# Patient Record
Sex: Male | Born: 1975 | Race: White | Hispanic: No | Marital: Single | Smoking: Never smoker
Health system: Southern US, Community
[De-identification: ages and names within clinical notes are randomized; demographics above are authoritative.]

## PROBLEM LIST (undated history)

## (undated) DIAGNOSIS — I4892 Unspecified atrial flutter: Secondary | ICD-10-CM

## (undated) DIAGNOSIS — D352 Benign neoplasm of pituitary gland: Secondary | ICD-10-CM

## (undated) HISTORY — DX: Unspecified atrial flutter: I48.92

## (undated) HISTORY — DX: Benign neoplasm of pituitary gland: D35.2

---

## 2004-03-04 HISTORY — PX: VASECTOMY: SHX75

## 2011-06-05 ENCOUNTER — Ambulatory Visit: Payer: Self-pay | Admitting: Internal Medicine

## 2013-12-08 DIAGNOSIS — E291 Testicular hypofunction: Secondary | ICD-10-CM | POA: Insufficient documentation

## 2013-12-08 DIAGNOSIS — E221 Hyperprolactinemia: Secondary | ICD-10-CM | POA: Insufficient documentation

## 2014-04-18 ENCOUNTER — Ambulatory Visit (INDEPENDENT_AMBULATORY_CARE_PROVIDER_SITE_OTHER): Payer: PRIVATE HEALTH INSURANCE | Admitting: Sports Medicine

## 2014-04-18 ENCOUNTER — Encounter: Payer: Self-pay | Admitting: Sports Medicine

## 2014-04-18 VITALS — BP 127/68 | Ht 72.0 in | Wt 189.0 lb

## 2014-04-18 DIAGNOSIS — M7750 Other enthesopathy of unspecified foot: Secondary | ICD-10-CM

## 2014-04-18 DIAGNOSIS — M7741 Metatarsalgia, right foot: Secondary | ICD-10-CM

## 2014-04-18 NOTE — Progress Notes (Signed)
   Subjective:    Patient ID: Terry Henson, male    DOB: Oct 02, 1975, 38 y.o.   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 He localizes the pain to the plantar aspect of his right foot near the second metatarsal. It began acutely after a race on August 8th. That race consisted of running up quite a few hills. He started to have pain shortly thereafter. He notices that his pain is most severe with walking. He has been able to continue running and in fact he was able to run in a couple of races this past weekend with little to no returning pain. Today he has minimal pain. He localizes his discomfort to the second metatarsal head. He denies pain elsewhere in the foot. He has not noticed any swelling. He denies any numbness or tingling into his toes. He has a pair of rigid three-quarter length orthotics which were made for him many years ago. Up until now he has found those orthotics to be very comfortable. Prior to his foot pain starting, he developed a joggers toe nail on the right great toe. This caused him to alter his running gait just a bit. He is wondering whether or not his pain is a combination of this altered running form coupled with his three-quarter lengths orthotics and his very hilly race in early August.  Past medical history. He has a history of a macro prolactinoma. Medications include cabergoline and testosterone cypionate. 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    Review of Systems     Objective:   Physical Exam Well-developed, fit-appearing. No acute distress. Awake alert and oriented x3. Vital signs reviewed.  Right foot: There is tenderness to palpation at the second metatarsal head. Slight tenderness to palpation between the second and third metatarsal heads as well. Slight callus buildup. No tenderness to  palpation across the dorsum of the foot. Negative metatarsal squeeze. Negative mulders. Cavus foot. Neurovascularly intact distally.  MSK ultrasound of his right foot: Limited images of his foot were obtained. There is fluid within the second metatarsal joint space as well as some surrounding soft tissue edema at the plantar aspect of the second metatarsal head. No obvious Morton's neuroma. Flexor tendon is intact. No stress fracture. Findings consistent with metatarsalgia.       Assessment & Plan:  Right foot pain secondary to metatarsalgia  We are going to give the patient a pair of green sports insoles with a metatarsal pad on the right. After some experimentation we were able to correctly place the pad in a comfortable position. He is going to continue with his three-quarter length semirigid orthotics as well. He will return to the office in 4 weeks for construction of full-length semirigid custom orthotics. He may continue with activity as tolerated in the interim.

## 2014-05-19 ENCOUNTER — Encounter: Payer: Self-pay | Admitting: Sports Medicine

## 2014-05-19 ENCOUNTER — Ambulatory Visit (INDEPENDENT_AMBULATORY_CARE_PROVIDER_SITE_OTHER): Payer: PRIVATE HEALTH INSURANCE | Admitting: Sports Medicine

## 2014-05-19 VITALS — BP 117/79 | HR 55 | Ht 72.0 in | Wt 189.0 lb

## 2014-05-19 DIAGNOSIS — M216X9 Other acquired deformities of unspecified foot: Secondary | ICD-10-CM

## 2014-05-19 DIAGNOSIS — G8929 Other chronic pain: Secondary | ICD-10-CM

## 2014-05-19 DIAGNOSIS — M79671 Pain in right foot: Secondary | ICD-10-CM

## 2014-05-19 NOTE — Progress Notes (Signed)
   Subjective:    Patient ID: Terry Henson, male    DOB: [birth year], 38 y.o.   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  Pain has been ongoing now for about 2 months, exacerbated by race in August where he was running up/down hills.  He localizes his discomfort to the second metatarsal head of plantar aspect. He denies pain elsewhere in the foot. He has not noticed any swelling. He denies any numbness or tingling into his toes. He has a pair of rigid three-quarter length orthotics which were made for him many years ago. Up until now he has found those orthotics to be very comfortable and at last visit, he had green insoles with MT pad inserted to try to help with the pain. However, pt was unable to use this due to compression in his shoe and wore the insoles for about 1-2 days.  However, his pain has continued to improve and only notices it when barefoot or after 12+ mile runs.  Has not changed shoes or training routine since being seen at last visit.   Past medical history. He has a history of a macro prolactinoma.  PSHx- Non contributory Medications include cabergoline and testosterone cypionate. 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 FHx - Noncontributory     Review of Systems Per HPI, 12 point review of systems otherwise is negative     Objective:   Physical Exam Well-developed, fit-appearing. No acute distress. Awake alert and oriented x3. Vital signs reviewed.  Right foot:  Toes 2-3 on the R with hammering  There is tenderness to palpation at the second metatarsal head/third MT head + Morton's callous and Morton's foot  No tenderness to palpation across the dorsum of the foot.  Negative metatarsal squeeze.  Negative mulders.  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  Midfoot-Forefoot striker   Neurovascularly intact distally.     Assessment & Plan:  Right foot pain secondary to metatarsalgia  - Pt fitted today for custom orthotics due to ongoing pain in the MT region and not tolerating MT padding  -Patient was fitted for a : standard, cushioned, semi-rigid orthotic. The orthotic was heated and afterward the patient stood on the orthotic blank positioned on the orthotic stand. The patient was positioned in subtalar neutral position and 10 degrees of ankle dorsiflexion in a weight bearing stance. After completion of molding, a stable base was applied to the orthotic blank. The blank was ground to a stable position for weight bearing. Size: 11 Base: Blue EVA Posting: None  Additional orthotic padding: None -F/U PRN   >50% of the 25 minute visit was spent face to face counseling and discussing management options with the pt.

## 2015-12-28 ENCOUNTER — Other Ambulatory Visit: Payer: Self-pay

## 2015-12-28 ENCOUNTER — Encounter: Payer: Self-pay | Admitting: Family Medicine

## 2015-12-28 ENCOUNTER — Ambulatory Visit (INDEPENDENT_AMBULATORY_CARE_PROVIDER_SITE_OTHER)
Admission: RE | Admit: 2015-12-28 | Discharge: 2015-12-28 | Disposition: A | Discharge status: Home / Self Care | Payer: No Typology Code available for payment source | Source: Ambulatory Visit | Attending: Family Medicine | Admitting: Family Medicine

## 2015-12-28 ENCOUNTER — Ambulatory Visit (INDEPENDENT_AMBULATORY_CARE_PROVIDER_SITE_OTHER): Payer: No Typology Code available for payment source | Admitting: Family Medicine

## 2015-12-28 VITALS — BP 100/70 | HR 63 | Ht 72.0 in | Wt 181.0 lb

## 2015-12-28 DIAGNOSIS — M25551 Pain in right hip: Secondary | ICD-10-CM

## 2015-12-28 DIAGNOSIS — M5416 Radiculopathy, lumbar region: Secondary | ICD-10-CM | POA: Diagnosis not present

## 2015-12-28 DIAGNOSIS — S76201A Unspecified injury of adductor muscle, fascia and tendon of right thigh, initial encounter: Secondary | ICD-10-CM | POA: Diagnosis not present

## 2015-12-28 MED ORDER — GABAPENTIN 100 MG PO CAPS: 200.0000 mg | ORAL_CAPSULE | Freq: Every day | ORAL | Status: DC

## 2015-12-28 MED ORDER — PREDNISONE 50 MG PO TABS: 50.0000 mg | ORAL_TABLET | Freq: Every day | ORAL | Status: DC

## 2015-12-28 NOTE — Progress Notes (Signed)
[facility] [address], [place] [address] Phone: [phone] Subjective:     CC: Right hip pain   [id number] Terry Henson is a 40 y.o. male coming in with complaint of right hip pain. 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 Patient has noticed over the course last 2 months when he is running sometimes he gets the discomfort that seems to radiate from the posterior aspect of his hip anteriorly around the lateral aspect of his hip. Sometimes can be associated with some groin pain. Patient states once he stops stretches he seems to do relatively well. Patient states it is starting to affect his running. Patient does not remember any true injury but does remember 2 months ago while he was in a marathon he started having the discomfort. Rates the severity of pain is 4 out of 10. Tries not to take any over-the-counter medications. Continues to take vitamins regularly regular basis. Denies any radiation down the line, any numbness, or any weakness. Patient states that his leg does feels fatigued from time to time. Does have some associated back pain with it.     Past Medical History  Diagnosis Date  . Macroprolactinoma [facility])    Past Surgical History  Procedure Laterality Date  . Vasectomy  03/2004   Social History   Social History  . Marital Status: Divorced    Spouse Name: N/A  . Number of Children: N/A  . Years of Education: N/A   Social History Main Topics  . Smoking status: Never Smoker   . Smokeless tobacco: Never Used  . Alcohol Use: Yes  . Drug Use: No  . Sexual Activity: Not Asked   Other Topics Concern  . None   Social History Narrative   No Known Allergies Family History  Problem Relation Age of Onset  . Alcohol abuse Mother   . Hyperlipidemia Mother   . Hypertension Mother   . Mental illness Mother   . Alcohol abuse Father   . Hyperlipidemia Father   . Hypertension Father   . Mental illness  Father   . Cancer Sister     Past medical history, social, surgical and family history all reviewed in electronic medical record.  No pertanent information unless stated regarding to the chief complaint.   Review of Systems: No headache, visual changes, nausea, vomiting, diarrhea, constipation, dizziness, abdominal pain, skin rash, fevers, chills, night sweats, weight loss, swollen lymph nodes, body aches, joint swelling, muscle aches, chest pain, shortness of breath, mood changes.   Objective Blood pressure 100/70, pulse 63, height 6' (1.829 m), weight 181 lb (82.101 kg), SpO2 97 %.  General: No apparent distress alert and oriented x3 mood and affect normal, dressed appropriately.  HEENT: Pupils equal, extraocular movements intact  Respiratory: Patient's speak in full sentences and does not appear short of breath  Cardiovascular: No lower extremity edema, non tender, no erythema  Skin: Warm dry intact with no signs of infection or rash on extremities or on axial skeleton.  Abdomen: Soft nontender  Neuro: Cranial nerves II through XII are intact, neurovascularly intact in all extremities with 2+ DTRs and 2+ pulses.  Lymph: No lymphadenopathy of posterior or anterior cervical chain or axillae bilaterally.  Gait normal with good balance and coordination.  MSK:  Non tender with full range of motion and good stability and symmetric strength and tone of shoulders, elbows, wrist,  knee and ankles bilaterally.  Hip: right ROM  IR: 15 Deg, ER: 35 Deg, Flexion: 100 Deg, Extension: 80 Deg, Abduction: 45 Deg, Adduction: 255 Deg Strength IR: 5/5, ER: 5/5, Flexion: 5/5, Extension: 5/5, Abduction: 4/5, Adduction: 4/5 Pelvic alignment unremarkable to inspection and palpation. Standing hip rotation and gait without trendelenburg sign / unsteadiness. Greater trochanter without tenderness to palpation. No tenderness over piriformis and greater trochanter. No pain with FABER or FADIR. No SI joint  tenderness and normal minimal SI movement. Patient does have what seems to be a mild positive straight leg test. Contralateral hip unremarkable  MSK US performed of: Right hip This study was ordered, performed, and interpreted by Charlann Boxer D.O.  Hip: Trochanteric bursa without swelling or effusion. Acetabular labrum visualized with some degenerative changes but no true tear appreciated Adductor muscle is what appears to be some mild tendinitis at the insertion. Femoral neck appears unremarkable without increased power doppler signal along Cortex.  IMPRESSION:  NORMAL ULTRASONOGRAPHIC EXAMINATION OF THE HIP.    Impression and Recommendations:     This case required medical decision making of moderate complexity.      Note: This dictation was prepared with Dragon dictation along with smaller phrase technology. Any transcriptional errors that result from this process are unintentional.

## 2015-12-28 NOTE — Patient Instructions (Signed)
Good to  See you  Ice is your friend after activity  Focus on hip flexor and back extension for more of your cool down for sure Prednosne daily for 5 days Gabapentin 100mg  at night for first week then 200mg  thereafter Love the vitamins for now Consider Vitamin D 4000IU daily  DHEA 50 mg daily for 4 weeks.  [facility] # [id number] See me again in 3 weeks to make sure you are doing well.

## 2015-12-28 NOTE — Assessment & Plan Note (Signed)
I do believe the patient's pain is likely secondary to more of a lumbar radiculopathy. Patient continues to be very active. No significant weakness but mild radicular symptoms. Based on patient's presentation likely L3-L4. X-rays are pending. Patient given prednisone and gabapentin today. 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 We will not limit his activity at this time but if worsening symptoms patient knows to take a step back. Patient will come back and see me again in 3 weeks for further evaluation.

## 2015-12-28 NOTE — Progress Notes (Signed)
Pre visit review using our clinic review tool, if applicable. No additional management support is needed unless otherwise documented below in the visit note. 

## 2015-12-29 DIAGNOSIS — S76201A Unspecified injury of adductor muscle, fascia and tendon of right thigh, initial encounter: Secondary | ICD-10-CM | POA: Insufficient documentation

## 2016-01-10 ENCOUNTER — Ambulatory Visit: Payer: PRIVATE HEALTH INSURANCE | Admitting: Family Medicine

## 2016-01-16 ENCOUNTER — Encounter: Payer: Self-pay | Admitting: Family Medicine

## 2016-01-16 ENCOUNTER — Ambulatory Visit (INDEPENDENT_AMBULATORY_CARE_PROVIDER_SITE_OTHER): Payer: No Typology Code available for payment source | Admitting: Family Medicine

## 2016-01-16 VITALS — BP 110/70 | HR 56 | Ht 72.0 in | Wt 184.0 lb

## 2016-01-16 DIAGNOSIS — S76201A Unspecified injury of adductor muscle, fascia and tendon of right thigh, initial encounter: Secondary | ICD-10-CM

## 2016-01-16 MED ORDER — VITAMIN D (ERGOCALCIFEROL) 1.25 MG (50000 UNIT) PO CAPS: 50000.0000 [IU] | ORAL_CAPSULE | ORAL | Status: DC

## 2016-01-16 NOTE — Progress Notes (Signed)
Pre visit review using our clinic review tool, if applicable. No additional management support is needed unless otherwise documented below in the visit note. 

## 2016-01-16 NOTE — Progress Notes (Signed)
[facility] [address], [place] [address] Phone: [phone] Subjective:     CC: Right hip pain follow-up  [id number] Terry Henson is a 40 y.o. male coming in with complaint of right hip pain. Patient's tenderness and symptoms and more of a lumbar radiculopathy. Patient elected to try prednisone as well as gabapentin. X-rays were ordered and independently visualized by me showing no significant bony abnormality. Patient does have a history of herniated disc previously though. Patient also had some mild injury of an adductor muscle. Patient was to try some home exercises, icing protocol, and decreased certain activities. Patient states while he is on the prednisone did seem to make a significant improvement. Since he's been off the prednisone and pain has come back. 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 Patient is finding it difficult to continue to do so. He is able to make it usually through a run, or rays but unfortunately then afterwards has some weakness of this right side. Patient states when he goes to bed and wakes up he seems to be doing a little better. Possibly the gabapentin is helping. Patient did get all the other vitamins but has not notice any significant improvement at this time.pain seems to go from the anterior medial aspect of the hip to the posterior lateral aspect of the hip. Does not know exactly what this could be. Has not stopped him from activities at this time.     Past Medical History  Diagnosis Date  . Macroprolactinoma [facility])    Past Surgical History  Procedure Laterality Date  . Vasectomy  03/2004   Social History   Social History  . Marital Status: Divorced    Spouse Name: N/A  . Number of Children: N/A  . Years of Education: N/A   Social History Main Topics  . Smoking status: Never Smoker   . Smokeless tobacco: Never Used  . Alcohol Use: Yes  . Drug Use: No  .  Sexual Activity: Not Asked   Other Topics Concern  . None   Social History Narrative   No Known Allergies Family History  Problem Relation Age of Onset  . Alcohol abuse Mother   . Hyperlipidemia Mother   . Hypertension Mother   . Mental illness Mother   . Alcohol abuse Father   . Hyperlipidemia Father   . Hypertension Father   . Mental illness Father   . Cancer Sister     Past medical history, social, surgical and family history all reviewed in electronic medical record.  No pertanent information unless stated regarding to the chief complaint.   Review of Systems: No headache, visual changes, nausea, vomiting, diarrhea, constipation, dizziness, abdominal pain, skin rash, fevers, chills, night sweats, weight loss, swollen lymph nodes, body aches, joint swelling, muscle aches, chest pain, shortness of breath, mood changes.   Objective Blood pressure 110/70, pulse 56, height 6' (1.829 m), weight 184 lb (83.462 kg), SpO2 98 %.  General: No apparent distress alert and oriented x3 mood and affect normal, dressed appropriately.  HEENT: Pupils equal, extraocular movements intact  Respiratory: Patient's speak in full sentences and does not appear short of breath  Cardiovascular: No lower extremity edema, non tender, no erythema  Skin: Warm dry intact with no signs of infection or rash on extremities or on axial skeleton.  Abdomen: Soft nontender  Neuro: Cranial nerves II through XII are intact, neurovascularly intact in all extremities with 2+  DTRs and 2+ pulses.  Lymph: No lymphadenopathy of posterior or anterior cervical chain or axillae bilaterally.  Gait normal with good balance and coordination.  MSK:  Non tender with full range of motion and good stability and symmetric strength and tone of shoulders, elbows, wrist,  knee and ankles bilaterally.  Hip: right ROM IR: 15 Deg, ER: 35 Deg, Flexion: 100 Deg, Extension: 80 Deg, Abduction: 45 Deg, Adduction: 255 Deg Strength IR: 5/5,  ER: 5/5, Flexion: 5/5, Extension: 5/5, Abduction: 3+/5, Adduction: 3+/5 Pelvic alignment unremarkable to inspection and palpation. Standing hip rotation and gait without trendelenburg sign / unsteadiness. Greater trochanter without tenderness to palpation. No tenderness over piriformis and greater trochanter. No pain with FABER or FADIR. No SI joint tenderness and normal minimal SI movement. Negative straight leg test. Contralateral hip unremarkable Very minimal improvement from previous exam.with possibly some increasing weakness     Impression and Recommendations:     This case required medical decision making of moderate complexity.      Note: This dictation was prepared with Dragon dictation along with smaller phrase technology. Any transcriptional errors that result from this process are unintentional.

## 2016-01-16 NOTE — Assessment & Plan Note (Signed)
Very difficult to assess at this time. Patient has had x-rays of the hip and back with no significant bony abnormality noted. These were and apparently reviewed by me. Patient at this time is having such decrease in function and having even true weakness than I'm concerned and further imaging is warranted at this time. Possibly obturator muscle or nerve injury could be contributing. Had Dr. Lindaann Slough strain, tear or even possible labral pathology could be contributing to this. Modalities fit completely perfect with patient's history but do not know diagnosis at this time without advance imaging. Change patient's vitamin D to once weekly patient will come back again in 1-2 days after MRI and we will discuss further.

## 2016-01-16 NOTE — Patient Instructions (Signed)
Good to see you  I am sorry we do not have an answer yet.  MRI is ordered.  [facility] # [id number] Try the gabapentin before your run, only 100mg  See me again 1-2 days after the MRI and bring the disc.  Once weekly vitamin D

## 2016-01-17 ENCOUNTER — Telehealth: Payer: Self-pay | Admitting: Family Medicine

## 2016-01-17 NOTE — Telephone Encounter (Signed)
Mri order sent to [facility].

## 2016-01-17 NOTE — Telephone Encounter (Signed)
Patient is calling regarding referral   He states that he got the fax # for the [facility] facility you want him to use. That is F: [id number] P: [phone] opt 2

## 2016-01-18 ENCOUNTER — Encounter: Payer: Self-pay | Admitting: Family Medicine

## 2016-01-29 ENCOUNTER — Encounter: Payer: Self-pay | Admitting: Family Medicine

## 2016-02-14 ENCOUNTER — Encounter: Payer: Self-pay | Admitting: Family Medicine

## 2016-04-13 ENCOUNTER — Other Ambulatory Visit: Payer: Self-pay | Admitting: Family Medicine

## 2016-04-14 NOTE — Telephone Encounter (Signed)
Refill done.  

## 2016-07-02 ENCOUNTER — Other Ambulatory Visit: Payer: Self-pay | Admitting: Family Medicine

## 2016-09-28 ENCOUNTER — Other Ambulatory Visit: Payer: Self-pay | Admitting: Family Medicine

## 2016-12-18 ENCOUNTER — Other Ambulatory Visit: Payer: Self-pay | Admitting: Family Medicine

## 2017-03-10 ENCOUNTER — Other Ambulatory Visit: Payer: Self-pay | Admitting: Family Medicine

## 2017-03-10 NOTE — Telephone Encounter (Signed)
Refill done.  

## 2017-12-13 NOTE — Progress Notes (Signed)
[facility] [address], [place] [address] Phone: [phone] Subjective:    I'm seeing this patient by the request  of:    CC: Left leg pain  [id number]  Terry Henson is a 42 y.o. male coming in with complaint of left hamstring pain. 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 Ran on the track a few weeks after. Eliminated running for 3 weeks prior to [facility]. Proximal hamstring he has scar tissue. Noticed bike saddle too high (brand new bike). Later [place] Pain presented. 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 Experienced numbness and tingling once. On a prednisone taper (12 days). Hasn't been running.   Onset- Chronic Location- Hamstring (distal); started medial now it's medial   Character- Dull, ache, sharp, burning  Aggravating factors- Running, kicking with fins, isometric  Reliving factors- Dry needling Therapies tried- Ice, heat, foam roll, cupping Severity-6 out of 10     Past Medical History:  Diagnosis Date  . Macroprolactinoma [facility])    Past Surgical History:  Procedure Laterality Date  . VASECTOMY  03/2004   Social History   Socioeconomic History  . Marital status: Divorced    Spouse name: Not on file  . Number of children: Not on file  . Years of education: Not on file  . Highest education level: Not on file  Occupational History  . Not on file  Social Needs  . Financial resource strain: Not on file  . Food insecurity:    Worry: Not on file    Inability: Not on file  . Transportation needs:    Medical: Not on file    Non-medical: Not on file  Tobacco Use  . Smoking status: Never Smoker  . Smokeless tobacco: Never Used  Substance and Sexual Activity  . Alcohol use: Yes  . Drug use: No  . Sexual activity: Not on file  Lifestyle  . Physical activity:    Days per week: Not on file    Minutes per session: Not on file  . Stress: Not on file  Relationships  . Social  connections:    Talks on phone: Not on file    Gets together: Not on file    Attends religious service: Not on file    Active member of club or organization: Not on file    Attends meetings of clubs or organizations: Not on file    Relationship status: Not on file  Other Topics Concern  . Not on file  Social History Narrative  . Not on file   No Known Allergies Family History  Problem Relation Age of Onset  . Alcohol abuse Mother   . Hyperlipidemia Mother   . Hypertension Mother   . Mental illness Mother   . Alcohol abuse Father   . Hyperlipidemia Father   . Hypertension Father   . Mental illness Father   . Cancer Sister      Past medical history, social, surgical and family history all reviewed in electronic medical record.  No pertanent information unless stated regarding to the chief complaint.   Review of Systems:Review of systems updated and as accurate as of 12/14/17  No headache, visual changes, nausea, vomiting, diarrhea, constipation, dizziness, abdominal pain, skin rash, fevers, chills, night sweats, weight loss, swollen lymph nodes, body aches, joint swelling, muscle aches, chest pain, shortness of breath, mood changes.   Objective  Blood pressure 120/74, pulse 66, height 6' (1.829 m), weight  190 lb (86.2 kg), SpO2 98 %. Systems examined below as of 12/14/17   General: No apparent distress alert and oriented x3 mood and affect normal, dressed appropriately.  HEENT: Pupils equal, extraocular movements intact  Respiratory: Patient's speak in full sentences and does not appear short of breath  Cardiovascular: No lower extremity edema, non tender, no erythema  Skin: Warm dry intact with no signs of infection or rash on extremities or on axial skeleton.  Abdomen: Soft nontender  Neuro: Cranial nerves II through XII are intact, neurovascularly intact in all extremities with 2+ DTRs and 2+ pulses.  Lymph: No lymphadenopathy of posterior or anterior cervical chain or  axillae bilaterally.  Gait normal with good balance and coordination.  MSK:  Non tender with full range of motion and good stability and symmetric strength and tone of shoulders, elbows, wrist, hip, and ankles bilaterally.  Left leg shows the patient does have some tightness with full extension of the knee.  Pain over the distal aspect of the hamstring laterally.  Patient has 4+ out of 5 strength with hamstring flexion compared to the contralateral side.  Limited musculoskeletal ultrasound was performed and interpreted by Lyndal Pulley  Limited ultrasound of patient's distal hamstring does show that patient has what appears to be a fascial irritation with increasing Doppler flow.  No true tear appreciated. Impression: Hamstring irritation of the distal hamstring with no true tear appreciated.   Impression and Recommendations:     This case required medical decision making of moderate complexity.      Note: This dictation was prepared with Dragon dictation along with smaller phrase technology. Any transcriptional errors that result from this process are unintentional.

## 2017-12-14 ENCOUNTER — Ambulatory Visit: Payer: Self-pay

## 2017-12-14 ENCOUNTER — Ambulatory Visit: Payer: 59 | Admitting: Family Medicine

## 2017-12-14 ENCOUNTER — Encounter: Payer: Self-pay | Admitting: Family Medicine

## 2017-12-14 VITALS — BP 120/74 | HR 66 | Ht 72.0 in | Wt 190.0 lb

## 2017-12-14 DIAGNOSIS — S76312A Strain of muscle, fascia and tendon of the posterior muscle group at thigh level, left thigh, initial encounter: Secondary | ICD-10-CM | POA: Diagnosis not present

## 2017-12-14 DIAGNOSIS — S76302A Unspecified injury of muscle, fascia and tendon of the posterior muscle group at thigh level, left thigh, initial encounter: Secondary | ICD-10-CM | POA: Insufficient documentation

## 2017-12-14 DIAGNOSIS — M79605 Pain in left leg: Secondary | ICD-10-CM

## 2017-12-14 MED ORDER — GABAPENTIN 100 MG PO CAPS: 200.0000 mg | ORAL_CAPSULE | Freq: Every day | ORAL | 3 refills | Status: DC

## 2017-12-14 MED ORDER — VITAMIN D (ERGOCALCIFEROL) 1.25 MG (50000 UNIT) PO CAPS: 50000.0000 [IU] | ORAL_CAPSULE | ORAL | 0 refills | Status: DC

## 2017-12-14 NOTE — Patient Instructions (Signed)
Good to see you  Ice 20 minutes 2 times daily. Usually after activity and before bed. [place] exercises would be best  Zone 2 for the next 2 weeks.  Thigh compression with working out but a little higher then where it has been.  Stay hydrated Avoid hills if possible.  Gabapentin 100-200mg  at night Once weekly vitamin D  Dry needling up to 2 times a week  See me again after the race!

## 2017-12-14 NOTE — Assessment & Plan Note (Signed)
Distally I do believe the patient has more of myofascial irritation that could be causing a potential nerve impingement.  This is likely contributing to some of the discomfort and pain.  Patient about icing regimen and home exercises.  We discussed which activities to do which wants to avoid.  Discussed compression.  Topical anti-inflammatories, gabapentin patient given home exercises.  Follow-up again in 4 weeks

## 2018-01-27 DIAGNOSIS — D497 Neoplasm of unspecified behavior of endocrine glands and other parts of nervous system: Secondary | ICD-10-CM | POA: Insufficient documentation

## 2018-02-23 ENCOUNTER — Other Ambulatory Visit: Payer: Self-pay | Admitting: Family Medicine

## 2018-02-23 MED ORDER — VITAMIN D (ERGOCALCIFEROL) 1.25 MG (50000 UNIT) PO CAPS: 50000.0000 [IU] | ORAL_CAPSULE | ORAL | 0 refills | Status: DC

## 2018-02-23 NOTE — Telephone Encounter (Signed)
Refill done.  

## 2018-05-24 ENCOUNTER — Other Ambulatory Visit: Payer: Self-pay | Admitting: Family Medicine

## 2018-09-07 NOTE — Progress Notes (Signed)
[facility] [place], [place] [address] Phone: [phone] Subjective:     CC: Right gluteal pain  SVX:[id number]  Terry Henson is a 43 y.o. male coming in with complaint of right glute pain. Patient last seen in May 2019. Patient was performing RDLs and felt a pop in the right glute near the top of his lift in November 2019. Felt a pop. Also did a lot of rowing and then went for a long run and this exacerbated his pain. No discoloration in the hip at that time. Was able to continue to run. Ran today 11 miles at 6 minute pace. Pain with deep knee bending in glute med. Patient feels like his knee is caving in when he runs. Has been doing hamstring exercises and stability work. Patient also notes going to a plant based diet.     Patient's lumbar spine x-rays were taken in May 2017.  These were independently visualized by me showing no significant arthritic changes.  Past Medical History:  Diagnosis Date  . 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 VASECTOMY  03/2004   Social History   Socioeconomic History  . Marital status: Divorced    Spouse name: Not on file  . Number of children: Not on file  . Years of education: Not on file  . Highest education level: Not on file  Occupational History  . Not on file  Social Needs  . Financial resource strain: Not on file  . Food insecurity:    Worry: Not on file    Inability: Not on file  . Transportation needs:    Medical: Not on file    Non-medical: Not on file  Tobacco Use  . Smoking status: Never Smoker  . Smokeless tobacco: Never Used  Substance and Sexual Activity  . Alcohol use: Yes  . Drug use: No  . Sexual activity: Not on file  Lifestyle  . Physical activity:    Days per week: Not on file    Minutes per session: Not on file  . Stress: Not on file  Relationships  . Social connections:    Talks on phone: Not on file    Gets together:  Not on file    Attends religious service: Not on file    Active member of club or organization: Not on file    Attends meetings of clubs or organizations: Not on file    Relationship status: Not on file  Other Topics Concern  . Not on file  Social History Narrative  . Not on file   No Known Allergies Family History  Problem Relation Age of Onset  . Alcohol abuse Mother   . Hyperlipidemia Mother   . Hypertension Mother   . Mental illness Mother   . Alcohol abuse Father   . Hyperlipidemia Father   . Hypertension Father   . Mental illness Father   . Cancer Sister     Current Outpatient Medications (Endocrine & Metabolic):  .  cabergoline (DOSTINEX) 0.5 MG tablet,  .  testosterone cypionate (DEPOTESTOTERONE CYPIONATE) 200 MG/ML injection,   Current Outpatient Medications (Cardiovascular):  .  nitroGLYCERIN (NITRODUR - DOSED IN MG/24 HR) 0.2 mg/hr patch, 1/4 patch daily     Current Outpatient Medications (Other):  .  gabapentin (NEURONTIN) 100 MG capsule, Take 2 capsules (200 mg total) by mouth at bedtime. .  Vitamin D, Ergocalciferol, (DRISDOL) 50000 units CAPS capsule, TAKE ONE CAPSULE  BY MOUTH ONCE WEEKLY    Past medical history, social, surgical and family history all reviewed in electronic medical record.  No pertanent information unless stated regarding to the chief complaint.   Review of Systems:  No headache, visual changes, nausea, vomiting, diarrhea, constipation, dizziness, abdominal pain, skin rash, fevers, chills, night sweats, weight loss, swollen lymph nodes, body aches, joint swelling, muscle aches, chest pain, shortness of breath, mood changes.   Objective  Blood pressure 120/82, pulse 72, height 6' (1.829 m), weight 182 lb (82.6 kg), SpO2 98 %.   General: No apparent distress alert and oriented x3 mood and affect normal, dressed appropriately.  HEENT: Pupils equal, extraocular movements intact  Respiratory: Patient's speak in full sentences and does not  appear short of breath  Cardiovascular: No lower extremity edema, non tender, no erythema  Skin: Warm dry intact with no signs of infection or rash on extremities or on axial skeleton.  Abdomen: Soft nontender  Neuro: Cranial nerves II through XII are intact, neurovascularly intact in all extremities with 2+ DTRs and 2+ pulses.  Lymph: No lymphadenopathy of posterior or anterior cervical chain or axillae bilaterally.  Gait normal with good balance and coordination.  MSK:  Non tender with full range of motion and good stability and symmetric strength and tone of shoulders, elbows, wrist, , knee and ankles bilaterally.   Foot exam does show the patient does have what appears to be some mild splaying between the first and second toes with mild breakdown of the transverse arch. Back Exam:  Inspection: Unremarkable  Motion: Flexion 45 deg, Extension 45 deg, Side Bending to 45 deg bilaterally,  Rotation to 45 deg bilaterally  SLR laying: Negative  XSLR laying: Negative  Palpable tenderness: None. FABER: Tightness bilaterally.  More pain over the right gluteal area Sensory change: Gross sensation intact to all lumbar and sacral dermatomes.  Reflexes: 2+ at both patellar tendons, 2+ at achilles tendons, Babinski's downgoing.  Strength at foot  Plantar-flexion: 5/5 Dorsi-flexion: 5/5 Eversion: 5/5 Inversion: 5/5  Leg strength  Quad: 5/5 Hamstring: 5/5 Hip flexor: 5/5 Hip abductors: 5/5  Gait unremarkable.  Hip: Right ROM Shows full range of motion.  Patient does have some tightness of the gluteal area and some tenderness.  Some tightness of the hamstring on the right side compared to the contralateral side as well.   Limited musculoskeletal ultrasound was performed and interpreted by Lyndal Pulley  Limited ultrasound of patient's proximal hamstring shows a very mild ischial bursitis as well as what appears to be chronic scar tissue formation around the hamstring.  No true retraction noted.   Patient's gluteal area has what appears to be a neovascularization that seems to be transversing the muscle.  No masses appreciated.  Possible hypoechoic changes between the superficial and deep layers.  No true tear or defect appreciated. Impression: Strain or tendinopathy of the right gluteal medius   Impression and Recommendations:     This case required medical decision making of moderate complexity. The above documentation has been reviewed and is accurate and complete Lyndal Pulley, DO       Note: This dictation was prepared with Dragon dictation along with smaller phrase technology. Any transcriptional errors that result from this process are unintentional.

## 2018-09-08 ENCOUNTER — Encounter: Payer: Self-pay | Admitting: Family Medicine

## 2018-09-08 ENCOUNTER — Ambulatory Visit: Payer: Self-pay

## 2018-09-08 ENCOUNTER — Ambulatory Visit: Payer: 59 | Admitting: Family Medicine

## 2018-09-08 ENCOUNTER — Other Ambulatory Visit: Payer: Self-pay | Admitting: Family Medicine

## 2018-09-08 VITALS — BP 120/82 | HR 72 | Ht 72.0 in | Wt 182.0 lb

## 2018-09-08 DIAGNOSIS — M25551 Pain in right hip: Secondary | ICD-10-CM | POA: Diagnosis not present

## 2018-09-08 DIAGNOSIS — M67951 Unspecified disorder of synovium and tendon, right thigh: Secondary | ICD-10-CM | POA: Insufficient documentation

## 2018-09-08 DIAGNOSIS — M6798 Unspecified disorder of synovium and tendon, other site: Secondary | ICD-10-CM | POA: Diagnosis not present

## 2018-09-08 DIAGNOSIS — M216X2 Other acquired deformities of left foot: Secondary | ICD-10-CM | POA: Insufficient documentation

## 2018-09-08 MED ORDER — NITROGLYCERIN 0.2 MG/HR TD PT24: MEDICATED_PATCH | TRANSDERMAL | 1 refills | Status: DC

## 2018-09-08 NOTE — Telephone Encounter (Signed)
Copied from [place] [id number]. Topic: General - Other >> Sep 08, 2018  5:42 PM Mcneil, Ja-Kwan wrote: Reason for CRM: Pt stated that he received a text alert from [facility] that the Rx for nitroGLYCERIN (NITRODUR - DOSED IN MG/24 HR) 0.2 mg/hr patch is not available. Pt requests that the Rx be sent to [place], [place] [phone] (Phone)  [phone] (Fax)

## 2018-09-08 NOTE — Assessment & Plan Note (Signed)
Transverse arch breakdown discussed with patient has been wearing custom orthotics previously.  They seem to be worn out.  Patient is a very active individual and we will make custom orthotics that are little bit more reactive than what he has had previously.  We will have him set up for that in the near future

## 2018-09-08 NOTE — Patient Instructions (Addendum)
Good to see you  Glute but should do well  K2 daily about 200mg  for 4 weeks Exercises 3 times a week.  No instability or one leg exercises for at least 2 weeks Ice after activity to decrease inflammaton  Nitroglycerin Protocol   Apply 1/4 nitroglycerin patch to affected area daily.  Change position of patch within the affected area every 24 hours.  You may experience a headache during the first 1-2 weeks of using the patch, these should subside.  If you experience headaches after beginning nitroglycerin patch treatment, you may take your preferred over the counter pain reliever.  Another side effect of the nitroglycerin patch is skin irritation or rash related to patch adhesive.  Please notify our office if you develop more severe headaches or rash, and stop the patch.  Tendon healing with nitroglycerin patch may require 12 to 24 weeks depending on the extent of injury.  Men should not use if taking Viagra, Cialis, or Levitra.   Do not use if you have migraines or rosacea.   Compression shorts with a lot of activity  We will call you when we get the orthotics See me again in 5-6 weeks

## 2018-09-08 NOTE — Assessment & Plan Note (Signed)
Tremors gluteus medius injury.  We discussed with patient in great length.  Ultrasound does show some mild increase in neovascularization in the area but no true tear appreciated.  Does appear that between the deep and superficial fibers a potential layer of hypoechoic changes.  Patient will do nitroglycerin.  Warned potential side effects.  Did check any interaction with his other medications and he should do relatively well.  We discussed home exercises and patient work with Product/process development scientist.  Discussed compression, discussed continuing the vitamin D.  Patient will continue to be active and follow-up with me again in 4 to 8 weeks

## 2018-09-16 ENCOUNTER — Telehealth: Payer: Self-pay

## 2018-09-16 NOTE — Telephone Encounter (Signed)
Called patient to schedule orthotics appointment.

## 2018-09-20 NOTE — Progress Notes (Signed)
Procedure Note   Patient was fitted for a : standard, cushioned, semi-rigid orthotic. The orthotic was heated and afterward the patient patient seated position and molded The patient was positioned in subtalar neutral position and 10 degrees of ankle dorsiflexion in a weight bearing stance. After completion of molding, patient did have orthotic management The blank was ground to a stable position for weight bearing. Size: 11 Base: Carbon fiber Additional Posting and Padding:  The patient ambulated these, and they were very comfortable.

## 2018-09-21 ENCOUNTER — Ambulatory Visit (INDEPENDENT_AMBULATORY_CARE_PROVIDER_SITE_OTHER): Payer: 59 | Admitting: Family Medicine

## 2018-09-28 ENCOUNTER — Ambulatory Visit (INDEPENDENT_AMBULATORY_CARE_PROVIDER_SITE_OTHER): Payer: 59 | Admitting: Sports Medicine

## 2018-09-28 VITALS — BP 106/72 | Ht 72.0 in | Wt 183.0 lb

## 2018-09-28 DIAGNOSIS — M7742 Metatarsalgia, left foot: Secondary | ICD-10-CM | POA: Diagnosis not present

## 2018-09-28 DIAGNOSIS — M7741 Metatarsalgia, right foot: Secondary | ICD-10-CM | POA: Diagnosis not present

## 2018-09-29 NOTE — Progress Notes (Addendum)
Subjective: Terry Henson is a 43 year old male who presents for custom orthotics.  He had a pair of custom orthotics made 5 years ago, which he has been wearing for running and daily use since that time.  There is significant breakdown at this time.  He states for the last few months he is noticed some pain back into the metatarsal bones of his feet on both sides.  He denies any numbness and tingling.  Denies any weakness.  Objective:  Bilateral foot exam: No erythema, warmth, swelling noted.  Tenderness palpation at the metatarsal heads of 2 and 3 on bilateral feet.  Full range of motion dorsiflexion, plantarflexion, eversion, inversion.  Negative anterior drawer bilaterally.  Assessment and plan: Metatarsalgia of the bilateral feet.  Patient was fitted for a : standard, cushioned, semi-rigid orthotic. The orthotic was heated, placed on the orthotic stand. The patient was positioned in subtalar neutral position and 10 degrees of ankle dorsiflexion in a weight bearing stance on the heated orthotic blank After completion of molding, a stable base was applied to the orthotic blank. The blank was ground to a stable position for weight bearing. Blank: Men's 11 Base: Blue EVA Posting:none   We created custom orthotics as noted above.  Patient has had relief for many years with orthotics.  We will see him back as needed if he has any issues with the orthotics.  Face to face time spent was 45 minutes. This time  included evaluation of foot pathology, associated joint issues and gait abnormalities.    Greater than 50% of our face to face time was spent in counseling regarding foot/ ankle /associated joint problems and gait issues, discussion of therapeutic options, measurement and manufacture of custom molded orthotic as detailed above.  Patient seen and evaluated with the sports medicine fellow.  I agree with the above plan of care.

## 2018-10-10 NOTE — Progress Notes (Signed)
[facility] [place], [place] [address] Phone: [phone] Subjective:   I Terry Henson am serving as a Education administrator for Dr. Hulan Saas.   CC: Right-sided gluteal follow-up  SFK:[id number]   09/08/2018 Tremors gluteus medius injury.  We discussed with patient in great length.  Ultrasound does show some mild increase in neovascularization in the area but no true tear appreciated.  Does appear that between the deep and superficial fibers a potential layer of hypoechoic changes.  Patient will do nitroglycerin.  Warned potential side effects.  Did check any interaction with his other medications and he should do relatively well.  We discussed home exercises and patient work with Product/process development scientist.  Discussed compression, discussed continuing the vitamin D.  Patient will continue to be active and follow-up with me again in 4 to 8 weeks  Transverse arch breakdown discussed with patient has been wearing custom orthotics previously.  They seem to be worn out.  Patient is a very active individual and we will make custom orthotics that are little bit more reactive than what he has had previously.  We will have him set up for that in the near future  10/11/2018 Terry Henson is a 43 y.o. male coming in with complaint of hip and foot pain. Gluteus is doing better but still has pain with running. Adductor is also painful. States that he feels the same pain he has been treated for.      Patient was intermittent continue daily as well as vitamin D.  Started nitroglycerin.  Patient states the hip pain on the gluteal area is about 98% better.  States that it is significantly less painful.  Noticing more of the abductor pain though.  States that it seems to be after sitting after doing his running.  Had something very similar to this previously.  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  Patient is concerned that this is potentially contributing again.  Past  Medical History:  Diagnosis Date  . 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 VASECTOMY  03/2004   Social History   Socioeconomic History  . Marital status: Divorced    Spouse name: Not on file  . Number of children: Not on file  . Years of education: Not on file  . Highest education level: Not on file  Occupational History  . Not on file  Social Needs  . Financial resource strain: Not on file  . Food insecurity:    Worry: Not on file    Inability: Not on file  . Transportation needs:    Medical: Not on file    Non-medical: Not on file  Tobacco Use  . Smoking status: Never Smoker  . Smokeless tobacco: Never Used  Substance and Sexual Activity  . Alcohol use: Yes  . Drug use: No  . Sexual activity: Not on file  Lifestyle  . Physical activity:    Days per week: Not on file    Minutes per session: Not on file  . Stress: Not on file  Relationships  . Social connections:    Talks on phone: Not on file    Gets together: Not on file    Attends religious service: Not on file    Active member of club or organization: Not on file    Attends meetings of clubs or organizations: Not on file    Relationship status: Not on file  Other  Topics Concern  . Not on file  Social History Narrative  . Not on file   No Known Allergies Family History  Problem Relation Age of Onset  . Alcohol abuse Mother   . Hyperlipidemia Mother   . Hypertension Mother   . Mental illness Mother   . Alcohol abuse Father   . Hyperlipidemia Father   . Hypertension Father   . Mental illness Father   . Cancer Sister     Current Outpatient Medications (Endocrine & Metabolic):  .  cabergoline (DOSTINEX) 0.5 MG tablet,  .  testosterone cypionate (DEPOTESTOTERONE CYPIONATE) 200 MG/ML injection,  .  predniSONE (DELTASONE) 50 MG tablet, Take 1 tablet (50 mg total) by mouth daily.  Current Outpatient Medications (Cardiovascular):  .  nitroGLYCERIN (NITRODUR -  DOSED IN MG/24 HR) 0.2 mg/hr patch, 1/4 patch daily     Current Outpatient Medications (Other):  Marland Kitchen  Vitamin D, Ergocalciferol, (DRISDOL) 50000 units CAPS capsule, TAKE ONE CAPSULE BY MOUTH ONCE WEEKLY    Past medical history, social, surgical and family history all reviewed in electronic medical record.  No pertanent information unless stated regarding to the chief complaint.   Review of Systems:  No headache, visual changes, nausea, vomiting, diarrhea, constipation, dizziness, abdominal pain, skin rash, fevers, chills, night sweats, weight loss, swollen lymph nodes, body aches, joint swelling,  chest pain, shortness of breath, mood changes.  Positive muscle aches  Objective  Blood pressure 102/68, pulse (!) 59, height 6' (1.829 m), weight 190 lb (86.2 kg), SpO2 98 %.    General: No apparent distress alert and oriented x3 mood and affect normal, dressed appropriately.  HEENT: Pupils equal, extraocular movements intact  Respiratory: Patient's speak in full sentences and does not appear short of breath  Cardiovascular: No lower extremity edema, non tender, no erythema  Skin: Warm dry intact with no signs of infection or rash on extremities or on axial skeleton.  Abdomen: Soft nontender  Neuro: Cranial nerves II through XII are intact, neurovascularly intact in all extremities with 2+ DTRs and 2+ pulses.  Lymph: No lymphadenopathy of posterior or anterior cervical chain or axillae bilaterally.  Gait normal with good balance and coordination.  MSK:  Non tender with full range of motion and good stability and symmetric strength and tone of shoulders, elbows, wrist,  knee and ankles bilaterally.  Hip: Right ROM IR: 35 Deg, ER: 45 Deg, Flexion: 120 Deg, Extension: 100 Deg, Abduction: 45 Deg, Adduction: 45 Deg Strength IR: 5/5, ER: 5/5, Flexion: 5/5, Extension: 5/5, Abduction: 5/5, Adduction: 5/5 Pelvic alignment unremarkable to inspection and palpation. Standing hip rotation and gait  without trendelenburg sign / unsteadiness. Greater trochanter without tenderness to palpation. Mild discomfort at the insertion of the hip flexor as well as the insertion of the gluteal tendon Mild pain with Corky Sox No SI joint tenderness and normal minimal SI movement.  Limited musculoskeletal ultrasound was performed and interpreted by Lyndal Pulley  Limited ultrasound shows the patient gluteal area does have significant changes noted with healing.  Increasing neovascularization in the area as well.  With staining 85% different. Patient though does have some chronic inflammation at the psoas musculotendinous juncture but no true tearing noted at the insertion of the tendon. Impression: Gluteal tendon improvements with hip flexor tendinitis   Impression and Recommendations:     This case required medical decision making of moderate complexity. The above documentation has been reviewed and is accurate and complete Lyndal Pulley, DO  Note: This dictation was prepared with Dragon dictation along with smaller phrase technology. Any transcriptional errors that result from this process are unintentional.

## 2018-10-11 ENCOUNTER — Ambulatory Visit: Payer: Self-pay

## 2018-10-11 ENCOUNTER — Encounter: Payer: Self-pay | Admitting: Family Medicine

## 2018-10-11 ENCOUNTER — Ambulatory Visit: Payer: 59 | Admitting: Family Medicine

## 2018-10-11 VITALS — BP 102/68 | HR 59 | Ht 72.0 in | Wt 190.0 lb

## 2018-10-11 DIAGNOSIS — M216X2 Other acquired deformities of left foot: Secondary | ICD-10-CM | POA: Diagnosis not present

## 2018-10-11 DIAGNOSIS — M6798 Unspecified disorder of synovium and tendon, other site: Secondary | ICD-10-CM

## 2018-10-11 DIAGNOSIS — M67951 Unspecified disorder of synovium and tendon, right thigh: Secondary | ICD-10-CM

## 2018-10-11 DIAGNOSIS — M24551 Contracture, right hip: Secondary | ICD-10-CM

## 2018-10-11 DIAGNOSIS — M25551 Pain in right hip: Secondary | ICD-10-CM

## 2018-10-11 MED ORDER — PREDNISONE 50 MG PO TABS: 50.0000 mg | ORAL_TABLET | Freq: Every day | ORAL | 0 refills | Status: DC

## 2018-10-11 NOTE — Assessment & Plan Note (Signed)
Patient was put in a [place] orthotics, discussed with patient about the possibility of having the right one more stability on the medial aspect.  Patient will call the other provider to do this.

## 2018-10-11 NOTE — Patient Instructions (Signed)
Good to see you  Try 1/4 patch on each side of the nitro, if too bad then go to alternating One knee down one knee up tilt pelvis forward.  Hands overhead then rotate to upper leg.  Hold 10 seconds, relax, repeat and do other side as well.  Very important when after being in a flexed position for a long amount of time.  Stretch always the hip flexor  If worsening pain take the prednisone  See me after [place]!

## 2018-10-11 NOTE — Assessment & Plan Note (Signed)
Interval healing noted.  Discussed with patient in great length, discussed home exercises and icing regimen.  We discussed continuing the nitroglycerin potentially alternating with the hip flexor strain that patient has.  Patient will follow-up with me again in 4 to 8 weeks

## 2018-10-11 NOTE — Assessment & Plan Note (Signed)
Hip flexor tightness.  We discussed the logical thing in this area.  Stretching afterwards.  Follow-up again in 4 to 8 weeks

## 2018-11-07 ENCOUNTER — Other Ambulatory Visit: Payer: Self-pay | Admitting: Family Medicine

## 2018-12-01 ENCOUNTER — Ambulatory Visit: Payer: 59 | Admitting: Family Medicine

## 2019-12-15 ENCOUNTER — Ambulatory Visit: Payer: BC Managed Care – PPO | Admitting: Family Medicine

## 2019-12-15 ENCOUNTER — Encounter: Payer: Self-pay | Admitting: Family Medicine

## 2019-12-15 ENCOUNTER — Other Ambulatory Visit: Payer: Self-pay

## 2019-12-15 DIAGNOSIS — M5416 Radiculopathy, lumbar region: Secondary | ICD-10-CM

## 2019-12-15 MED ORDER — PREDNISONE 50 MG PO TABS: ORAL_TABLET | ORAL | 0 refills | Status: DC

## 2019-12-15 NOTE — Patient Instructions (Addendum)
Good to see you Likely herniated disc Prednisone 50 mg daily for the next 5 days Exercise after training Avoid increasing intensity with running for 2 weeks DHEA 50 mg daily for 4 weeks then 2 weeks off Tart cherry just 1 pill See me again in 4 weeks if not perfect

## 2019-12-15 NOTE — Progress Notes (Signed)
[place] [address] [place] Phone: [phone] Subjective:   I Terry Henson am serving as a Education administrator for Dr. Hulan Saas.  This visit occurred during the SARS-CoV-2 public health emergency.  Safety protocols were in place, including screening questions prior to the visit, additional usage of staff PPE, and extensive cleaning of exam room while observing appropriate contact time as indicated for disinfecting solutions.   I'm seeing this patient by the request  of:  Patient, No Pcp Per  CC: Hip pain, back pain  [id number]   10/11/2018 Hip flexor tightness.  We discussed the logical thing in this area.  Stretching afterwards.  Follow-up again in 4 to 8 weeks  Patient was put in a [place] orthotics, discussed with patient about the possibility of having the right one more stability on the medial aspect.  Patient will call the other provider to do this.  Interval healing noted.  Discussed with patient in great length, discussed home exercises and icing regimen.  We discussed continuing the nitroglycerin potentially alternating with the hip flexor strain that patient has.  Patient will follow-up with me again in 4 to 8 weeks  Update 12/15/2019 Terry Henson is a 44 y.o. male coming in with complaint of low back pain. Patient states that mornings are worse. Running is ok. Recently ran 17 miles on Sunday with no pain. Pain was really bad that next day and into the week. He could still swim and bike. Didn't run again until that Friday. Pain wasn't worse or better. Next morning he was fine. Back is tight with sitting to standing. Pain is nagging and the patient wants to know where exactly this pain is coming from and what is causing it. States the pain feels like a spasm. Using aleve and Ibuproen. Patient is aware that his muscles are tight.  Patient does feel over the last week or so it seems to be improving a little bit.  Has changed his testosterone  injections to IM instead of subcu which he thinks has been somewhat helpful as well.  Planning on attempting to qualify for Ironman competitions this year    Past Medical History:  Diagnosis Date  . 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 VASECTOMY  03/2004   Social History   Socioeconomic History  . Marital status: Divorced    Spouse name: Not on file  . Number of children: Not on file  . Years of education: Not on file  . Highest education level: Not on file  Occupational History  . Not on file  Tobacco Use  . Smoking status: Never Smoker  . Smokeless tobacco: Never Used  Substance and Sexual Activity  . Alcohol use: Yes  . Drug use: No  . Sexual activity: Not on file  Other Topics Concern  . Not on file  Social History Narrative  . Not on file   Social Determinants of Health   Financial Resource Strain:   . Difficulty of Paying Living Expenses:   Food Insecurity:   . Worried About Charity fundraiser in the Last Year:   . Arboriculturist in the Last Year:   Transportation Needs:   . Film/video editor (Medical):   Marland Kitchen Lack of Transportation (Non-Medical):   Physical Activity:   . Days of Exercise per Week:   . Minutes of Exercise per Session:   Stress:   . Feeling of Stress :  Social Connections:   . Frequency of Communication with Friends and Family:   . Frequency of Social Gatherings with Friends and Family:   . Attends Religious Services:   . Active Member of Clubs or Organizations:   . Attends Archivist Meetings:   Marland Kitchen Marital Status:    No Known Allergies Family History  Problem Relation Age of Onset  . Alcohol abuse Mother   . Hyperlipidemia Mother   . Hypertension Mother   . Mental illness Mother   . Alcohol abuse Father   . Hyperlipidemia Father   . Hypertension Father   . Mental illness Father   . Cancer Sister     Current Outpatient Medications (Endocrine & Metabolic):  .   cabergoline (DOSTINEX) 0.5 MG tablet,  .  predniSONE (DELTASONE) 50 MG tablet, Take 1 tablet (50 mg total) by mouth daily. Marland Kitchen  testosterone cypionate (DEPOTESTOTERONE CYPIONATE) 200 MG/ML injection,  .  predniSONE (DELTASONE) 50 MG tablet, 1 tablet by mouth daily  Current Outpatient Medications (Cardiovascular):  .  nitroGLYCERIN (NITRODUR - DOSED IN MG/24 HR) 0.2 mg/hr patch, APPLY ONE-FOURTH PATCH DAILY AS DIRECTED     Current Outpatient Medications (Other):  Marland Kitchen  Vitamin D, Ergocalciferol, (DRISDOL) 50000 units CAPS capsule, TAKE ONE CAPSULE BY MOUTH ONCE WEEKLY   Reviewed prior external information including notes and imaging from  primary care provider As well as notes that were available from care everywhere and other healthcare systems.  Past medical history, social, surgical and family history all reviewed in electronic medical record.  No pertanent information unless stated regarding to the chief complaint.   Review of Systems:  No headache, visual changes, nausea, vomiting, diarrhea, constipation, dizziness, abdominal pain, skin rash, fevers, chills, night sweats, weight loss, swollen lymph nodes, body aches, joint swelling, chest pain, shortness of breath, mood changes. POSITIVE muscle aches  Objective  Blood pressure 110/78, pulse (!) 51, height 6' (1.829 m), weight 190 lb (86.2 kg), SpO2 98 %.   General: No apparent distress alert and oriented x3 mood and affect normal, dressed appropriately.  HEENT: Pupils equal, extraocular movements intact  Respiratory: Patient's speak in full sentences and does not appear short of breath  Cardiovascular: No lower extremity edema, non tender, no erythema  Neuro: Cranial nerves II through XII are intact, neurovascularly intact in all extremities with 2+ DTRs and 2+ pulses.  Gait normal with good balance and coordination.  MSK:  Non tender with full range of motion and good stability and symmetric strength and tone of shoulders, elbows,  wrist, hip, knee and ankles bilaterally.  Back exam shows the patient does have some loss of lordosis.  Tender to palpation more around the right sacroiliac joint in the gluteus minimus.  Patient has positive though straight leg test with mild radicular symptoms to the knee level at 25 degrees of forward flexion.  Worsening pain with dorsiflexion of the foot as well.  Neurovascularly intact distally with 2+ DTRs    Impression and Recommendations:     This case required medical decision making of moderate complexity. The above documentation has been reviewed and is accurate and complete Lyndal Pulley, DO       Note: This dictation was prepared with Dragon dictation along with smaller phrase technology. Any transcriptional errors that result from this process are unintentional.

## 2019-12-16 ENCOUNTER — Encounter: Payer: Self-pay | Admitting: Family Medicine

## 2019-12-16 NOTE — Assessment & Plan Note (Signed)
Lumbar radiculopathy, has had this previously.  Patient has had a stress fracture previously as well of the sacroiliac joint and will need to monitor.  Encouraged him to take the once weekly vitamin D still.  Discussed other medications.  Prednisone 50 mg given today as well.  Hold on gabapentin for now.  May need in the long run.  Follow-up again in 4  weeks

## 2020-01-13 ENCOUNTER — Ambulatory Visit: Payer: BC Managed Care – PPO | Admitting: Family Medicine

## 2020-04-13 ENCOUNTER — Encounter: Payer: Self-pay | Admitting: Family Medicine

## 2020-04-26 ENCOUNTER — Ambulatory Visit: Payer: Self-pay

## 2020-04-26 ENCOUNTER — Encounter: Payer: Self-pay | Admitting: Family Medicine

## 2020-04-26 ENCOUNTER — Other Ambulatory Visit: Payer: Self-pay

## 2020-04-26 ENCOUNTER — Ambulatory Visit (INDEPENDENT_AMBULATORY_CARE_PROVIDER_SITE_OTHER): Payer: 59

## 2020-04-26 ENCOUNTER — Ambulatory Visit: Payer: 59 | Admitting: Family Medicine

## 2020-04-26 VITALS — BP 120/80 | HR 59 | Ht 72.0 in | Wt 190.0 lb

## 2020-04-26 DIAGNOSIS — M79605 Pain in left leg: Secondary | ICD-10-CM | POA: Diagnosis not present

## 2020-04-26 DIAGNOSIS — S76302A Unspecified injury of muscle, fascia and tendon of the posterior muscle group at thigh level, left thigh, initial encounter: Secondary | ICD-10-CM | POA: Diagnosis not present

## 2020-04-26 NOTE — Patient Instructions (Addendum)
Good to see you You can do the nitro Continue the compression Avoid significant eccentrics of the hamstring Will refer you to lauren for new orthotics but don't change before race Recovery and maintaince next week especially for runs 50/50 treadmill and elliptical Ice hyper vault are good afterwards Askling exercises Dry needling once a week Good luck in [facility]  See me again in 5-6 weeks

## 2020-04-26 NOTE — Progress Notes (Signed)
[place] [address] [place] Phone: [phone] Subjective:   I Kandace Blitz am serving as a Education administrator for Dr. Hulan Saas.  This visit occurred during the SARS-CoV-2 public health emergency.  Safety protocols were in place, including screening questions prior to the visit, additional usage of staff PPE, and extensive cleaning of exam room while observing appropriate contact time as indicated for disinfecting solutions.   I'm seeing this patient by the request  of:  Patient, No Pcp Per  CC: left thigh pain   [id number]  Terry Henson is a 44 y.o. male coming in with complaint of left distal tibialis anterior and left adductor pain. Seen for similar issue in May 2019. Patient states that he did some races late July early August. States that after an airplane ride the distal leg was painful. Did not notice any pain before the plane ride. Ran a race and states he could barely walk after the race. Has had dry needling, grasting and other modalities. States the pain resolved and then he started experiencing left adductor pain. Race this past weekend and it felt like the facial adhesion he has had before. Walking up hills he feels tension. Tension is now gone and it feels much better. Still significant pain in the hamstring that is deep. States he has an Iron Man race in a month (October 24th) and he wants to be ready to go. Wants to know if it is DOMS or something else going on. Believes the 2 aggressive downhill runs is what flared the distal tibialis anterior pain. Believes it also has to do with ankle mobility and wants to know if structurally he is having issues. Tibialis anterior issue as resolved. States he has decreased his intensity and volume.         Past Medical History:  Diagnosis Date  . 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 VASECTOMY  03/2004   Social History   Socioeconomic  History  . Marital status: Divorced    Spouse name: Not on file  . Number of children: Not on file  . Years of education: Not on file  . Highest education level: Not on file  Occupational History  . Not on file  Tobacco Use  . Smoking status: Never Smoker  . Smokeless tobacco: Never Used  Substance and Sexual Activity  . Alcohol use: Yes  . Drug use: No  . Sexual activity: Not on file  Other Topics Concern  . Not on file  Social History Narrative  . Not on file   Social Determinants of Health   Financial Resource Strain:   . Difficulty of Paying Living Expenses: Not on file  Food Insecurity:   . Worried About Charity fundraiser in the Last Year: Not on file  . Ran Out of Food in the Last Year: Not on file  Transportation Needs:   . Lack of Transportation (Medical): Not on file  . Lack of Transportation (Non-Medical): Not on file  Physical Activity:   . Days of Exercise per Week: Not on file  . Minutes of Exercise per Session: Not on file  Stress:   . Feeling of Stress : Not on file  Social Connections:   . Frequency of Communication with Friends and Family: Not on file  . Frequency of Social Gatherings with Friends and Family: Not on file  . Attends Religious Services: Not on file  . Active  Member of Clubs or Organizations: Not on file  . Attends Archivist Meetings: Not on file  . Marital Status: Not on file   No Known Allergies Family History  Problem Relation Age of Onset  . Alcohol abuse Mother   . Hyperlipidemia Mother   . Hypertension Mother   . Mental illness Mother   . Alcohol abuse Father   . Hyperlipidemia Father   . Hypertension Father   . Mental illness Father   . Cancer Sister     Current Outpatient Medications (Endocrine & Metabolic):  .  cabergoline (DOSTINEX) 0.5 MG tablet,  .  predniSONE (DELTASONE) 50 MG tablet, Take 1 tablet (50 mg total) by mouth daily. .  predniSONE (DELTASONE) 50 MG tablet, 1 tablet by mouth daily .   testosterone cypionate (DEPOTESTOTERONE CYPIONATE) 200 MG/ML injection,   Current Outpatient Medications (Cardiovascular):  .  nitroGLYCERIN (NITRODUR - DOSED IN MG/24 HR) 0.2 mg/hr patch, APPLY ONE-FOURTH PATCH DAILY AS DIRECTED     Current Outpatient Medications (Other):  Marland Kitchen  Vitamin D, Ergocalciferol, (DRISDOL) 50000 units CAPS capsule, TAKE ONE CAPSULE BY MOUTH ONCE WEEKLY   Reviewed prior external information including notes and imaging from  primary care provider As well as notes that were available from care everywhere and other healthcare systems.  Past medical history, social, surgical and family history all reviewed in electronic medical record.  No pertanent information unless stated regarding to the chief complaint.   Review of Systems:  No headache, visual changes, nausea, vomiting, diarrhea, constipation, dizziness, abdominal pain, skin rash, fevers, chills, night sweats, weight loss, swollen lymph nodes, body aches, joint swelling, chest pain, shortness of breath, mood changes. POSITIVE muscle aches  Objective  Blood pressure 120/80, pulse (!) 59, height 6' (1.829 m), weight 190 lb (86.2 kg), SpO2 97 %.   General: No apparent distress alert and oriented x3 mood and affect normal, dressed appropriately.  HEENT: Pupils equal, extraocular movements intact  Respiratory: Patient's speak in full sentences and does not appear short of breath  Cardiovascular: No lower extremity edema, non tender, no erythema  Neuro: Cranial nerves II through XII are intact, neurovascularly intact in all extremities with 2+ DTRs and 2+ pulses.  Gait normal with good balance and coordination.  MSK:  Left leg mild tightness of hamstring, good strength, mild pain with rested ADDuction of the left leg, full ROM of hip back overall normal as well   Ltd msk [place] independently preformed and visualized by [place] [facility] shows hamstring in the belly about mid muscle belly has some chronic tearing  with mild increase in neovascularization, but no true tear.  Impressoin: chronic hamstring injury but no acute tear.     Impression and Recommendations:     The above documentation has been reviewed and is accurate and complete Lyndal Pulley, DO       Note: This dictation was prepared with Dragon dictation along with smaller phrase technology. Any transcriptional errors that result from this process are unintentional.

## 2020-04-30 ENCOUNTER — Encounter: Payer: Self-pay | Admitting: Family Medicine

## 2020-04-30 NOTE — Assessment & Plan Note (Signed)
Midsubstance, no acute tear, continue HEP, compression, nitro.  OK to run but discussed more low impact and stay on flat surfaces. rtc in 4-6 weeks

## 2020-05-31 ENCOUNTER — Ambulatory Visit: Payer: 59 | Admitting: Family Medicine

## 2020-05-31 ENCOUNTER — Encounter: Payer: Self-pay | Admitting: Family Medicine

## 2020-05-31 ENCOUNTER — Other Ambulatory Visit: Payer: Self-pay

## 2020-05-31 ENCOUNTER — Ambulatory Visit: Payer: Self-pay

## 2020-05-31 VITALS — BP 126/82 | HR 100 | Ht 72.0 in

## 2020-05-31 DIAGNOSIS — S76302A Unspecified injury of muscle, fascia and tendon of the posterior muscle group at thigh level, left thigh, initial encounter: Secondary | ICD-10-CM | POA: Diagnosis not present

## 2020-05-31 NOTE — Assessment & Plan Note (Signed)
No findings to be concern, discussed HEP, discussed vit and stop nitro  OK to do stripping, Hamstring unremarkable, rtc prn

## 2020-05-31 NOTE — Progress Notes (Signed)
[place] Phone: [phone] Subjective:   Terry Henson, am serving as a scribe for Dr. Hulan Saas. This visit occurred during the SARS-CoV-2 public health emergency.  Safety protocols were in place, including screening questions prior to the visit, additional usage of staff PPE, and extensive cleaning of exam room while observing appropriate contact time as indicated for disinfecting solutions.   I'm seeing this patient by the request  of:  Patient, Henson Pcp Per  CC: Hamstring follow-upHamstring follow up   [id number]   04/26/2020 [place], Henson acute tear, continue HEP, compression, nitro.  OK to run but discussed more low impact and stay on flat surfaces. rtc in 4-6 weeks    Update 05/31/2020 Terry Henson is a 44 y.o. male coming in with complaint of left hamstring tear. State that his race got cancelled. Ran marathon one night this week. 8 minute miles. States that his pain has improved and feels that ergonomics of tri bike on his trainer was the primary cause of pain. Has been doing hamstring strengthening exercises without pain.      Past Medical History:  Diagnosis Date  . 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 VASECTOMY  03/2004   Social History   Socioeconomic History  . Marital status: Divorced    Spouse name: Not on file  . Number of children: Not on file  . Years of education: Not on file  . Highest education level: Not on file  Occupational History  . Not on file  Tobacco Use  . Smoking status: Never Smoker  . Smokeless tobacco: Never Used  Substance and Sexual Activity  . Alcohol use: Yes  . Drug use: Henson  . Sexual activity: Not on file  Other Topics Concern  . Not on file  Social History Narrative  . Not on file   Social Determinants of Health   Financial Resource Strain:   . Difficulty of Paying Living Expenses: Not on file    Food Insecurity:   . Worried About Charity fundraiser in the Last Year: Not on file  . Ran Out of Food in the Last Year: Not on file  Transportation Needs:   . Lack of Transportation (Medical): Not on file  . Lack of Transportation (Non-Medical): Not on file  Physical Activity:   . Days of Exercise per Week: Not on file  . Minutes of Exercise per Session: Not on file  Stress:   . Feeling of Stress : Not on file  Social Connections:   . Frequency of Communication with Friends and Family: Not on file  . Frequency of Social Gatherings with Friends and Family: Not on file  . Attends Religious Services: Not on file  . Active Member of Clubs or Organizations: Not on file  . Attends Archivist Meetings: Not on file  . Marital Status: Not on file   Henson Known Allergies Family History  Problem Relation Age of Onset  . Alcohol abuse Mother   . Hyperlipidemia Mother   . Hypertension Mother   . Mental illness Mother   . Alcohol abuse Father   . Hyperlipidemia Father   . Hypertension Father   . Mental illness Father   . Cancer Sister     Current Outpatient Medications (Endocrine & Metabolic):  .  cabergoline (DOSTINEX) 0.5 MG tablet,  .  predniSONE (DELTASONE) 50 MG tablet, Take 1 tablet (50  mg total) by mouth daily. .  predniSONE (DELTASONE) 50 MG tablet, 1 tablet by mouth daily .  testosterone cypionate (DEPOTESTOTERONE CYPIONATE) 200 MG/ML injection,   Current Outpatient Medications (Cardiovascular):  .  nitroGLYCERIN (NITRODUR - DOSED IN MG/24 HR) 0.2 mg/hr patch, APPLY ONE-FOURTH PATCH DAILY AS DIRECTED     Current Outpatient Medications (Other):  Marland Kitchen  Vitamin D, Ergocalciferol, (DRISDOL) 50000 units CAPS capsule, TAKE ONE CAPSULE BY MOUTH ONCE WEEKLY   Reviewed prior external information including notes and imaging from  primary care provider As well as notes that were available from care everywhere and other healthcare systems.  Past medical history, social,  surgical and family history all reviewed in electronic medical record.  Henson pertanent information unless stated regarding to the chief complaint.   Review of Systems:  Henson headache, visual changes, nausea, vomiting, diarrhea, constipation, dizziness, abdominal pain, skin rash, fevers, chills, night sweats, weight loss, swollen lymph nodes, body aches, joint swelling, chest pain, shortness of breath, mood changes. POSITIVE muscle aches  Objective  Blood pressure 126/82, pulse 100, height 6' (1.829 m), SpO2 97 %.   General: Henson apparent distress alert and oriented x3 mood and affect normal, dressed appropriately.  HEENT: Pupils equal, extraocular movements intact  Respiratory: Patient's speak in full sentences and does not appear short of breath  Cardiovascular: Henson lower extremity edema, non tender, Henson erythema  MSK: hamstring Henson tenderness, full strength compared to contralateral side. Otherwise unremarkable     Impression and Recommendations:     The above documentation has been reviewed and is accurate and complete Lyndal Pulley, DO

## 2020-06-13 ENCOUNTER — Other Ambulatory Visit: Payer: Self-pay

## 2020-06-13 ENCOUNTER — Ambulatory Visit: Payer: 59 | Admitting: Physical Therapy

## 2020-06-13 DIAGNOSIS — M25571 Pain in right ankle and joints of right foot: Secondary | ICD-10-CM

## 2020-06-18 ENCOUNTER — Encounter: Payer: Self-pay | Admitting: Physical Therapy

## 2020-06-18 NOTE — Therapy (Signed)
[place], [place], [address] Phone: [phone]   Fax:  [phone]  Physical Therapy Evaluation  Patient Details  Name: Terry Henson MRN: [id number] Date of Birth: 1975-11-12 Referring Provider (PT): Charlann Boxer   Encounter Date: 06/13/2020   PT End of Session - 06/18/20 1236    Visit Number 1    Number of Visits 4    Date for PT Re-Evaluation 08/08/20    Authorization Type [employer]    PT Start Time 1430    PT Stop Time 1520    PT Time Calculation (min) 50 min    Activity Tolerance Patient tolerated treatment well    Behavior During Therapy [facility] for tasks assessed/performed           Past Medical History:  Diagnosis Date  . 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 VASECTOMY  03/2004    There were no vitals filed for this visit.    Subjective Assessment - 06/18/20 1232    Subjective Pt is endurance athlete, does triathalons and recent marathon. Has worn orthotics for years that are worn out, referred for updated orthotics. Pt wearing good shoes for running. States recent HS injury that is better, also states R gr toe pressure. Does not feel he is in need of other skilled PT treatment at this time.    Currently in Pain? No/denies              [facility] PT Assessment - 06/18/20 0001      Assessment   Medical Diagnosis L leg pain/orthotics     Referring Provider (PT) Charlann Boxer    Prior Therapy no      Balance Screen   Has the patient fallen in the past 6 months No      Prior Function   Level of Independence Independent      Cognition   Overall Cognitive Status Within Functional Limits for tasks assessed      Posture/Postural Control   Posture Comments Foot posture; high arch, neutral foot, increased callousing at gr toe on R, increased gripping/toe flexion with gr toe in standing and with stability/SLS.       ROM / Strength   AROM / PROM / Strength  AROM;Strength      AROM   Overall AROM Comments WNL      Strength   Overall Strength Comments WNL,                       Objective measurements completed on examination: See above findings.       [place] Adult PT Treatment/Exercise - 06/18/20 0001      Self-Care   Self-Care Other Self-Care Comments    Other Self-Care Comments  sz 11 Next step orthotics, molded, fitted today.                   PT Education - 06/18/20 1235    Education Details Discussed orthotic use, wear, precautions.    Person(s) Educated Patient    Methods Explanation;Demonstration;Verbal cues    Comprehension Verbalized understanding;Returned demonstration;Verbal cues required            PT Short Term Goals - 06/18/20 1237      PT SHORT TERM GOAL #1   Title Pt to have good fit, comfort in new orthotics, fit in shoes appropriate for his foot type.    Time 8  Period Weeks    Status New    Target Date 08/08/20                     Plan - 06/18/20 1240    Clinical Impression Statement Pt fitted with custom orthotics today, with good fit and comfort in current footwear. Pt will return for orthotic check as needed. Does have mild increased callousing at gr toe on R. Discussed increasing foot intrisic strengthening and SLS/stability for decreasing toe grip with running. Not in need of other skilled PT at this time.    Examination-Activity Limitations Stand    Examination-Participation Restrictions Community Activity;Yard Work    Stability/Clinical Decision Making Stable/Uncomplicated    Clinical Decision Making Low    Rehab Potential Good    PT Frequency 1x / week    PT Duration 8 weeks    PT Treatment/Interventions ADLs/Self Care Home Management;Cryotherapy;Electrical Stimulation;Ultrasound;Traction;Moist Heat;Iontophoresis 4mg /ml Dexamethasone;DME Instruction;Gait Scientist, forensic;Therapeutic activities;Therapeutic exercise;Functional mobility training;Orthotic  Fit/Training;Patient/family education;Neuromuscular re-education;Balance training;Manual techniques;Vasopneumatic Device;Taping;Passive range of motion;Dry needling;Energy conservation;Spinal Manipulations;Joint Manipulations    Consulted and Agree with Plan of Care Patient           Patient will benefit from skilled therapeutic intervention in order to improve the following deficits and impairments:  Improper body mechanics, Decreased activity tolerance  Visit Diagnosis: Pain in right ankle and joints of right foot     Problem List Patient Active Problem List   Diagnosis Date Noted  . Hip flexor tightness, right 10/11/2018  . Tendinopathy of right gluteus medius 09/08/2018  . 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 Hamstring injury, left, initial encounter 12/14/2017  . Injury of adductor muscle and tendon of right thigh 12/29/2015  . Lumbar radiculopathy 12/28/2015  . Hyperprolactinemia ([place]) 12/08/2013  . 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

## 2020-08-09 ENCOUNTER — Ambulatory Visit (INDEPENDENT_AMBULATORY_CARE_PROVIDER_SITE_OTHER): Payer: 59 | Admitting: Physical Therapy

## 2020-08-09 ENCOUNTER — Ambulatory Visit: Payer: 59 | Admitting: Physical Therapy

## 2020-08-09 ENCOUNTER — Encounter: Payer: Self-pay | Admitting: Physical Therapy

## 2020-08-09 ENCOUNTER — Other Ambulatory Visit: Payer: Self-pay

## 2020-08-09 DIAGNOSIS — M25571 Pain in right ankle and joints of right foot: Secondary | ICD-10-CM | POA: Diagnosis not present

## 2020-08-09 NOTE — Therapy (Addendum)
[facility] [address] [place], [place], [address] Phone: [phone]   Fax:  [phone]  Physical Therapy Treatment  Patient Details  Name: Terry Henson MRN: [id number] Date of Birth: [birth year] Referring Provider (PT): Charlann Boxer   Encounter Date: 08/09/2020   PT End of Session - 08/09/20 1706    Visit Number 2    Number of Visits 4    Date for PT Re-Evaluation 09/20/20    Authorization Type [employer]    PT Start Time 1600    PT Stop Time 1655    PT Time Calculation (min) 55 min    Activity Tolerance Patient tolerated treatment well    Behavior During Therapy [facility] for tasks assessed/performed           Past Medical History:  Diagnosis Date  . 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 VASECTOMY  03/2004    There were no vitals filed for this visit.   Subjective Assessment - 08/09/20 1705    Subjective Pt unable to wear orthotics, due to increased pain in ball of foot, bilaterally.    Currently in Pain? No/denies                             [facility] Adult PT Treatment/Exercise - 08/09/20 0001      Posture/Postural Control   Posture Comments Foot posture; high arch, neutral foot, increased callousing at gr toe on R, increased gripping/toe flexion with gr toe in standing and with stability/SLS.       Self-Care   Self-Care Other Self-Care Comments    Other Self-Care Comments  sz 11 blank with EVA arch support fabricated today.                    PT Short Term Goals - 06/18/20 1237      PT SHORT TERM GOAL #1   Title Pt to have good fit, comfort in new orthotics, fit in shoes appropriate for his foot type.    Time 8    Period Weeks    Status New    Target Date 08/08/20                    Plan - 08/09/20 1708    Clinical Impression Statement Pt re-fitted for orthotics today, With EVA foam arch support, for improved comfort and stability.  Pt with good fit and comfort of orthotics at visit today, will continue to assess with running and exercise. Pt will return if alterations needed. Discussed wear time, precautions and use.    Examination-Activity Limitations Stand    Examination-Participation Restrictions Community Activity;Yard Work    Stability/Clinical Decision Making Stable/Uncomplicated    Rehab Potential Good    PT Frequency 1x / week    PT Duration 8 weeks    PT Treatment/Interventions ADLs/Self Care Home Management;Cryotherapy;Electrical Stimulation;Ultrasound;Traction;Moist Heat;Iontophoresis 8m/ml Dexamethasone;DME Instruction;Gait tScientist, forensicTherapeutic activities;Therapeutic exercise;Functional mobility training;Orthotic Fit/Training;Patient/family education;Neuromuscular re-education;Balance training;Manual techniques;Vasopneumatic Device;Taping;Passive range of motion;Dry needling;Energy conservation;Spinal Manipulations;Joint Manipulations    Consulted and Agree with Plan of Care Patient           Patient will benefit from skilled therapeutic intervention in order to improve the following deficits and impairments:  Improper body mechanics,Decreased activity tolerance  Visit Diagnosis: Pain in right ankle and joints of right foot     Problem List Patient Active Problem List   Diagnosis Date  Noted  . Hip flexor tightness, right 10/11/2018  . Tendinopathy of right gluteus medius 09/08/2018  . 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 Hamstring injury, left, initial encounter 12/14/2017  . Injury of adductor muscle and tendon of right thigh 12/29/2015  . Lumbar radiculopathy 12/28/2015  . 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  Patient goals were partially met. Patient is being discharged due to meeting the stated rehab goals.  ?????    Lyndee Hensen, PT, DPT 3:49 PM  11/28/20

## 2021-04-23 ENCOUNTER — Encounter: Payer: Self-pay | Admitting: Physical Therapy

## 2021-11-12 IMAGING — DX DG LUMBAR SPINE COMPLETE 4+V
5 series · 5 of 5 positions shown · non-contrast
Comparison: None.

CLINICAL DATA: Back pain and leg pain while running.  No injury.

EXAM:
LUMBAR SPINE - COMPLETE 4+ VIEW

[l-spine ap]
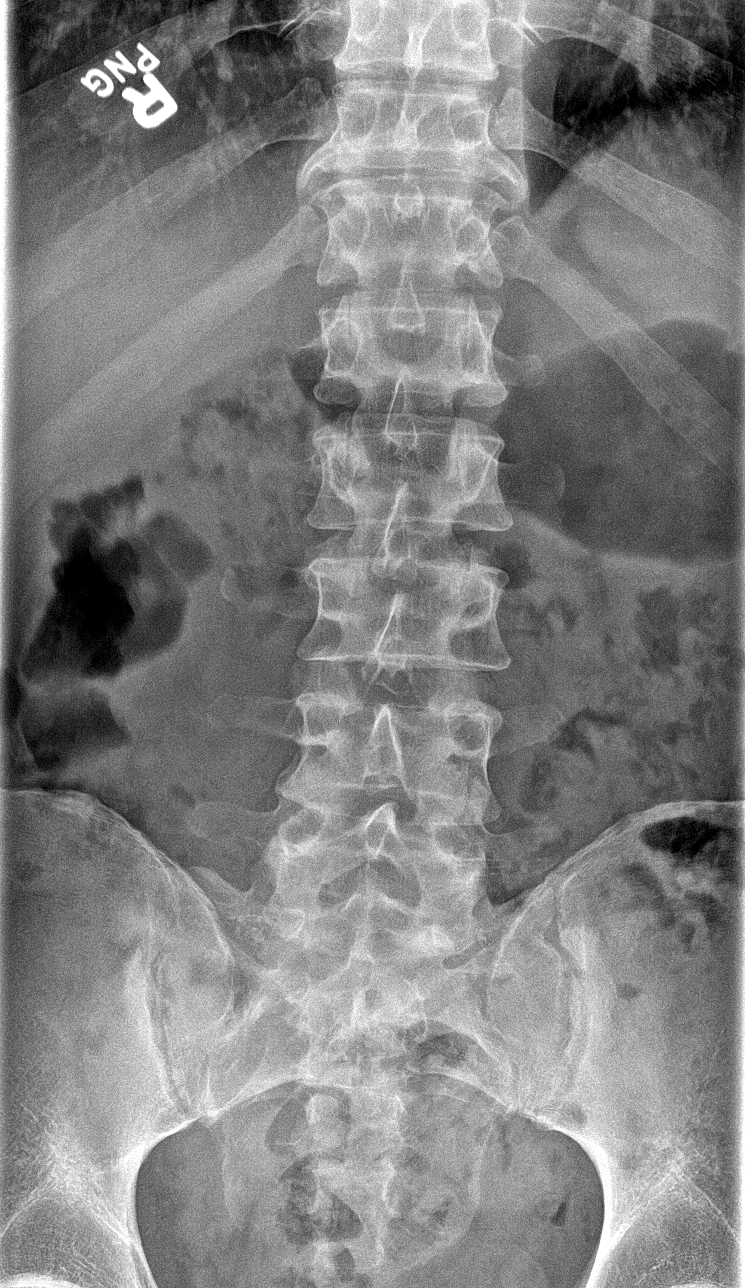

[l-spine obl (1 of 2)]
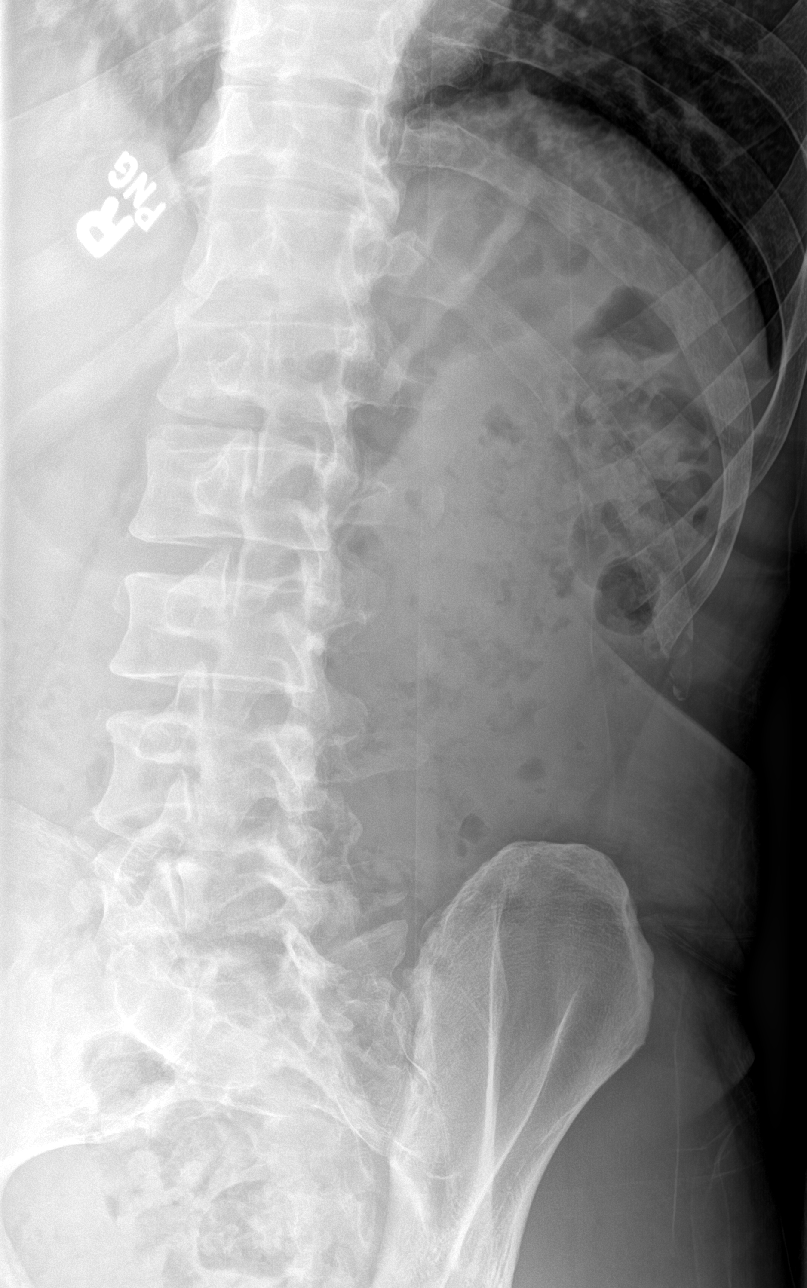

[l-spine obl (2 of 2)]
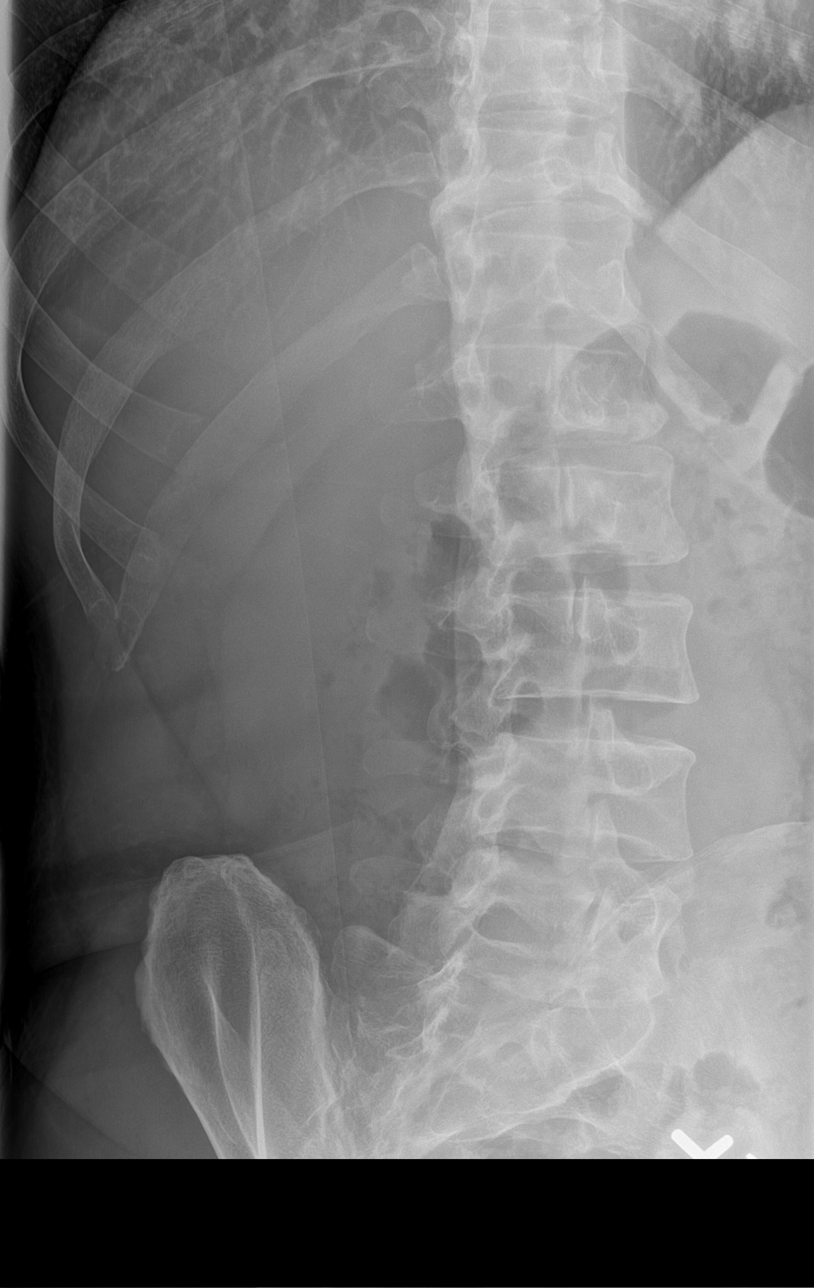

[l-spine lateral]
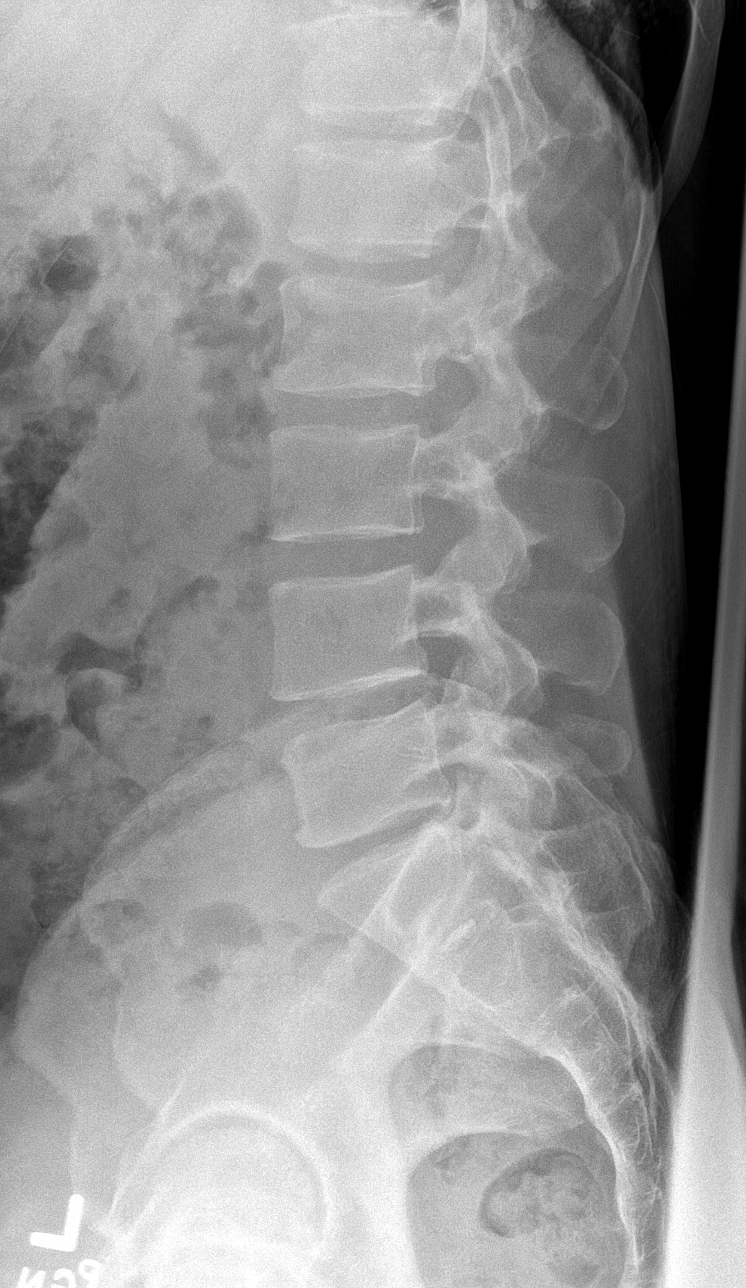

[l-spine spot]
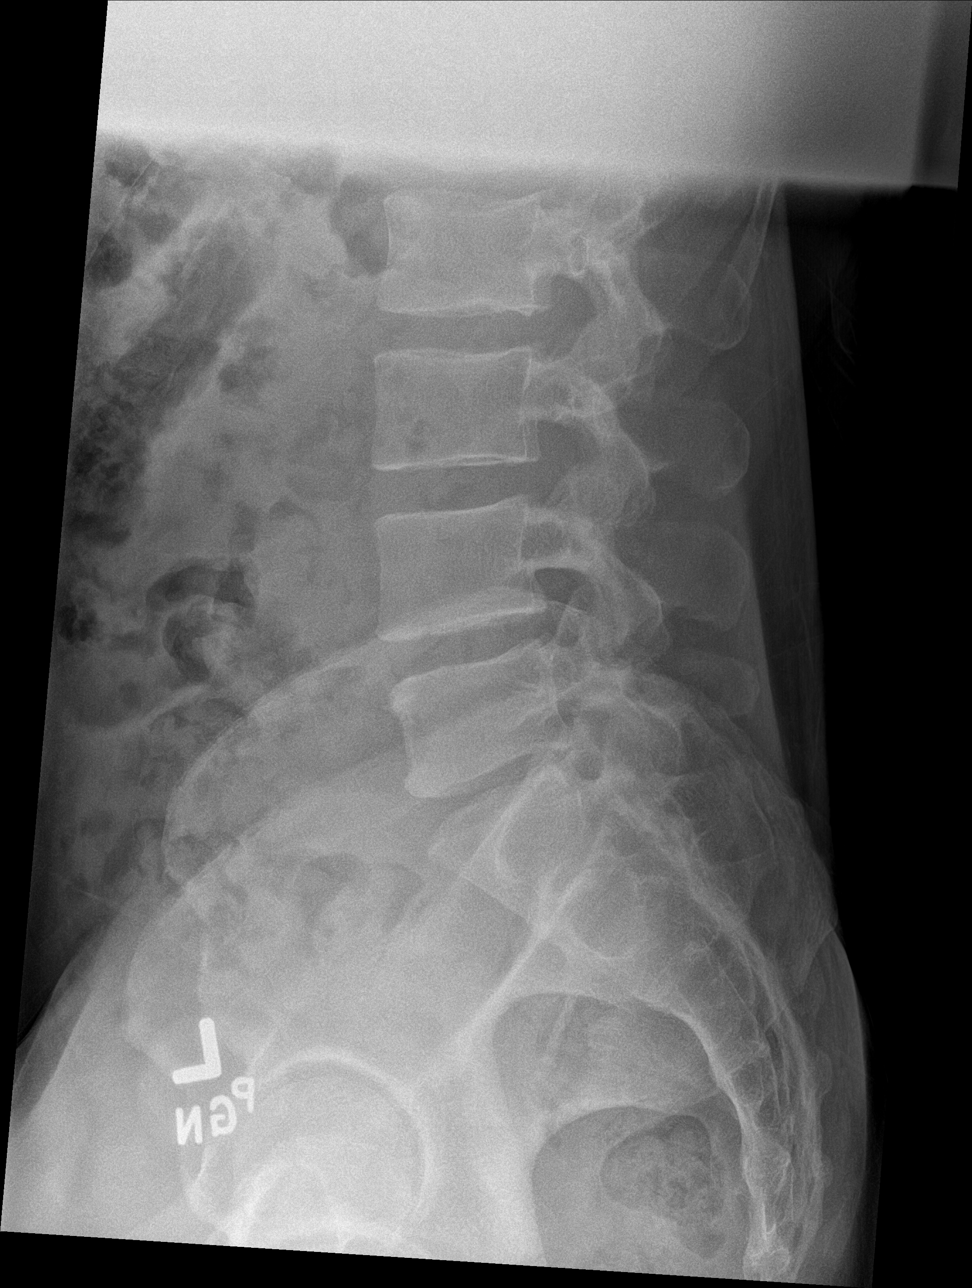

[5 of 5 positions shown; findings below may reference images not displayed]

FINDINGS: Vertebral body alignment and heights are normal. Minimal spondylosis
of the lumbar spine to include facet arthropathy over the lower
lumbar spine. Mild disc space narrowing at the L4-5 and L5-S1
levels. No compression fracture or spondylolisthesis. Mild
degenerate change of the sacroiliac joints.
IMPRESSION: 1. No acute findings.
2. Minimal spondylosis of the lumbar spine with disc disease at the
L4-5 and L5-S1 levels.

## 2022-04-25 DIAGNOSIS — L03114 Cellulitis of left upper limb: Secondary | ICD-10-CM | POA: Diagnosis not present

## 2022-04-28 ENCOUNTER — Ambulatory Visit: Payer: BC Managed Care – PPO

## 2022-04-28 ENCOUNTER — Observation Stay
Admission: EM | Admit: 2022-04-28 | Discharge: 2022-04-29 | Disposition: A | Discharge status: Home / Self Care | Payer: BC Managed Care – PPO | Attending: Internal Medicine | Admitting: Internal Medicine

## 2022-04-28 ENCOUNTER — Emergency Department: Payer: BC Managed Care – PPO

## 2022-04-28 ENCOUNTER — Other Ambulatory Visit: Payer: Self-pay | Admitting: Student

## 2022-04-28 ENCOUNTER — Encounter: Payer: Self-pay | Admitting: Medical Oncology

## 2022-04-28 DIAGNOSIS — R778 Other specified abnormalities of plasma proteins: Secondary | ICD-10-CM | POA: Diagnosis not present

## 2022-04-28 DIAGNOSIS — I4892 Unspecified atrial flutter: Secondary | ICD-10-CM | POA: Diagnosis not present

## 2022-04-28 DIAGNOSIS — R0602 Shortness of breath: Secondary | ICD-10-CM

## 2022-04-28 DIAGNOSIS — I272 Pulmonary hypertension, unspecified: Secondary | ICD-10-CM | POA: Diagnosis not present

## 2022-04-28 DIAGNOSIS — R002 Palpitations: Secondary | ICD-10-CM | POA: Diagnosis not present

## 2022-04-28 DIAGNOSIS — L03114 Cellulitis of left upper limb: Secondary | ICD-10-CM

## 2022-04-28 DIAGNOSIS — I483 Typical atrial flutter: Secondary | ICD-10-CM | POA: Diagnosis present

## 2022-04-28 DIAGNOSIS — Z79899 Other long term (current) drug therapy: Secondary | ICD-10-CM | POA: Insufficient documentation

## 2022-04-28 DIAGNOSIS — M79602 Pain in left arm: Secondary | ICD-10-CM | POA: Diagnosis not present

## 2022-04-28 DIAGNOSIS — I2721 Secondary pulmonary arterial hypertension: Secondary | ICD-10-CM

## 2022-04-28 DIAGNOSIS — M7989 Other specified soft tissue disorders: Secondary | ICD-10-CM | POA: Diagnosis not present

## 2022-04-28 DIAGNOSIS — D352 Benign neoplasm of pituitary gland: Secondary | ICD-10-CM

## 2022-04-28 LAB — CBC
HCT: 49.8 % (ref 39.0–52.0)
Hemoglobin: 16.3 g/dL (ref 13.0–17.0)
MCH: 30.4 pg (ref 26.0–34.0)
MCHC: 32.7 g/dL (ref 30.0–36.0)
MCV: 92.9 fL (ref 80.0–100.0)
Platelets: 286 10*3/uL (ref 150–400)
RBC: 5.36 MIL/uL (ref 4.22–5.81)
RDW: 12.3 % (ref 11.5–15.5)
WBC: 6.3 10*3/uL (ref 4.0–10.5)
nRBC: 0 % (ref 0.0–0.2)

## 2022-04-28 LAB — BRAIN NATRIURETIC PEPTIDE: B Natriuretic Peptide: 129.2 pg/mL — ABNORMAL HIGH (ref 0.0–100.0)

## 2022-04-28 LAB — BASIC METABOLIC PANEL
Anion gap: 9 (ref 5–15)
BUN: 26 mg/dL — ABNORMAL HIGH (ref 6–20)
CO2: 28 mmol/L (ref 22–32)
Calcium: 9.1 mg/dL (ref 8.9–10.3)
Chloride: 102 mmol/L (ref 98–111)
Creatinine, Ser: 1.08 mg/dL (ref 0.61–1.24)
GFR, Estimated: >60 mL/min (ref >60)
Glucose, Bld: 87 mg/dL (ref 70–99)
Potassium: 4.5 mmol/L (ref 3.5–5.1)
Sodium: 139 mmol/L (ref 135–145)

## 2022-04-28 LAB — TSH: TSH: 2.045 u[IU]/mL (ref 0.350–4.500)

## 2022-04-28 LAB — PROCALCITONIN: Procalcitonin: <0.1 ng/mL

## 2022-04-28 LAB — MAGNESIUM: Magnesium: 2.4 mg/dL (ref 1.7–2.4)

## 2022-04-28 LAB — TROPONIN I (HIGH SENSITIVITY): Troponin I (High Sensitivity): 27 ng/L — ABNORMAL HIGH (ref <18)

## 2022-04-28 MED ORDER — APIXABAN 5 MG PO TABS
5.0000 mg | ORAL_TABLET | Freq: Two times a day (BID) | ORAL | Status: DC
  Administered 2022-04-28 – 2022-04-29 (×2): 5 mg via ORAL
  Filled 2022-04-28 (×3): qty 1

## 2022-04-28 MED ORDER — IOHEXOL 350 MG/ML SOLN
75.0000 mL | Freq: Once | INTRAVENOUS | Status: AC | PRN
  Administered 2022-04-28: 75 mL via INTRAVENOUS

## 2022-04-28 NOTE — Consult Note (Signed)
ANTICOAGULATION CONSULT NOTE - Initial Consult  Pharmacy Consult for Eliquis Indication: atrial fibrillation  No Known Allergies  Patient Measurements: Height: 6' (182.9 cm) Weight: 86 kg (189 lb 9.5 oz) IBW/kg (Calculated) : 77.6   Vital Signs: Temp: 98.1 F (36.7 C) (09/25 1617) Temp Source: Oral (09/25 1617) BP: 119/88 (09/25 2004) Pulse Rate: 75 (09/25 2004)  Labs: Recent Labs    04/28/22 1611  HGB 16.3  HCT 49.8  PLT 286  CREATININE 1.08  TROPONINIHS 27*    Estimated Creatinine Clearance: 93.8 mL/min (by C-G formula based on SCr of 1.08 mg/dL).   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 male with no known previous cardiac history presented to [facility] 04/28/22 with chief complaint of palpitations and shortness of breath especially with exertion over the last several days. Found to have new onset atrial flutter.   Goal of Therapy:  Monitor platelets by anticoagulation protocol: Yes   Plan:  Initiate apixaban 5 mg BID   Dorothe Pea, PharmD, BCPS Clinical Pharmacist   04/28/2022,9:04 PM

## 2022-04-28 NOTE — ED Provider Notes (Signed)
[facility] Provider Note    Event Date/Time   First MD Initiated Contact with Patient 04/28/22 1612     (approximate)   History   Shortness of Breath   HPI  Terry Henson is a 46 y.o. male with no known previous cardiac history, does have a history of hyperprolactinemia  Patient is very athletic, participating in marathons and swims.  He reports about 2 weeks ago he had a arm monitor which is being trialed by the [employer] in his left arm.  He competed in a swimming event and shortly thereafter developed redness swelling and streaking in the arm and felt that it was sore and becoming infected.  He started on an antibiotic cephalexin, and is currently on his last day of dosing.  The left arm was swollen slightly and this is gone away and the redness discomfort pain and all symptoms have abated.  However at the time that the infection occurred he also noted that he was getting irregular heart rate monitoring including higher than typical heart rates and has been experiencing some palpitations and shortness of breath especially with exertion over the last several days  No lower leg swelling.  No pain in the right upper extremity.  No fevers or chills no cough.  Is not experiencing chest pain but reports feels very short of breath when exerting himself to the point he actually stopped at the fire department to have vitals checked and he was told his heart was extremely high  He did fly recently from [place]       Physical Exam   Triage Vital Signs: ED Triage Vitals  Enc Vitals Group     BP 04/28/22 1617 (!) 132/96     Pulse Rate 04/28/22 1617 75     Resp 04/28/22 1617 16     Temp 04/28/22 1617 98.1 F (36.7 C)     Temp Source 04/28/22 1617 Oral     SpO2 04/28/22 1617 100 %     Weight 04/28/22 1601 189 lb 9.5 oz (86 kg)     Height 04/28/22 1601 6' (1.829 m)     Head Circumference --      Peak Flow --      Pain Score 04/28/22 1601 0     Pain  Loc --      Pain Edu? --      Excl. in [place]? --     Most recent vital signs: Vitals:   04/28/22 1617 04/28/22 2004  BP: (!) 132/96 119/88  Pulse: 75 75  Resp: 16 (!) 9  Temp: 98.1 F (36.7 C)   SpO2: 100% 100%     General: Awake, no distress.  Very pleasant.  Well-built. CV:  Good peripheral perfusion.  Heart rate somewhat irregular.  Generally measuring in the mid 70s.  On telemetry appears to be in atrial flutter. Resp:  Normal effort.  Clear bilaterally.  Normal work of breathing Abd:  No distention.  Soft nontender Other:  No lower extremity edema.  No edema in upper extremities either.  Right upper extremity normal, lower extremities appear normal.  The left upper extremity has an area of appears to be a small scar tissue where he reports he had a monitor placed that appear to have gotten infected 2 weeks ago.  However the area now is soft nontender does have a slight area of induration below it but there is no tenderness no warmth and no erythema.  Appears to be an  area of healing   ED Results / Procedures / Treatments   Labs (all labs ordered are listed, but only abnormal results are displayed) Labs Reviewed  BASIC METABOLIC PANEL - Abnormal; Notable for the following components:      Result Value   BUN 26 (*)    All other components within normal limits  BRAIN NATRIURETIC PEPTIDE - Abnormal; Notable for the following components:   B Natriuretic Peptide 129.2 (*)    All other components within normal limits  TROPONIN I (HIGH SENSITIVITY) - Abnormal; Notable for the following components:   Troponin I (High Sensitivity) 27 (*)    All other components within normal limits  CULTURE, BLOOD (ROUTINE X 2)  CULTURE, BLOOD (ROUTINE X 2)  CBC  TSH  MAGNESIUM  PROCALCITONIN     EKG  And interpreted by me at 1610 heart rate 70 QRS 100 QTc 430 Atrial flutter with normal underlying ventricular rate.  No clear evidence of acute ischemia.  Hard to somewhat discern T wave  abnormalities given the underlying flutter, but no acute ischemic appearance is to noted.   RADIOLOGY  US Venous Img Upper Uni Left  Result Date: 04/28/2022 CLINICAL DATA:  Swelling in LEFT upper extremity, had an infection, question DVT EXAM: LEFT UPPER EXTREMITY VENOUS DOPPLER ULTRASOUND TECHNIQUE: Gray-scale sonography with graded compression, as well as color Doppler and duplex ultrasound were performed to evaluate the upper extremity deep venous system from the level of the subclavian vein and including the jugular, axillary, basilic, radial, ulnar and upper cephalic vein. Spectral Doppler was utilized to evaluate flow at rest and with distal augmentation maneuvers. COMPARISON:  None Available. FINDINGS: Contralateral Subclavian Vein: Respiratory phasicity is normal and symmetric with the symptomatic side. No evidence of thrombus. Normal compressibility. Internal Jugular Vein: No evidence of thrombus. Normal compressibility, respiratory phasicity and response to augmentation. Subclavian Vein: No evidence of thrombus. Normal compressibility, respiratory phasicity and response to augmentation. Axillary Vein: No evidence of thrombus. Normal compressibility, respiratory phasicity and response to augmentation. Cephalic Vein: No evidence of thrombus. Normal compressibility, respiratory phasicity and response to augmentation. Basilic Vein: No evidence of thrombus. Normal compressibility, respiratory phasicity and response to augmentation. Brachial Veins: No evidence of thrombus. Normal compressibility, respiratory phasicity and response to augmentation. Radial Veins: No evidence of thrombus. Normal compressibility, respiratory phasicity and response to augmentation. Ulnar Veins: No evidence of thrombus. Normal compressibility, respiratory phasicity and response to augmentation. Venous Reflux:  None visualized. Other Findings: Ill-defined subcutaneous thickening and infiltration in the posterior upper arm, area  approximately 2.5 x 2.6 x 1.0 cm in size. This could represent cellulitis or developing abscess but does not appear discretely loculated at this point. IMPRESSION: No evidence of DVT within the LEFT upper extremity. Ill-defined subcutaneous thickening and infiltration at posterior upper arm 2.5 x 2.6 x 1.0 cm in size, question cellulitis or developing abscess; this does not appear discretely loculated at this time. Electronically Signed   By: Lavonia Dana M.D.   On: 04/28/2022 18:02   CT Angio Chest PE W and/or Wo Contrast  Result Date: 04/28/2022 CLINICAL DATA:  Rule out acute pulmonary embolus. Palpitations and shortness of breath. Recent infection to left upper extremity. EXAM: CT ANGIOGRAPHY CHEST WITH CONTRAST TECHNIQUE: Multidetector CT imaging of the chest was performed using the standard protocol during bolus administration of intravenous contrast. Multiplanar CT image reconstructions and MIPs were obtained to evaluate the vascular anatomy. RADIATION DOSE REDUCTION: This exam was performed according to the departmental dose-optimization program  which includes automated exposure control, adjustment of the mA and/or kV according to patient size and/or use of iterative reconstruction technique. CONTRAST:  88m OMNIPAQUE IOHEXOL 350 MG/ML SOLN COMPARISON:  None Available. FINDINGS: Cardiovascular: Satisfactory opacification of the pulmonary arteries to the segmental level. No evidence of pulmonary embolism. Mild cardiac enlargement. No pericardial effusion. Increased caliber of the main pulmonary artery measures 3.8 cm consistent with PA hypertension. Mediastinum/Nodes: No enlarged mediastinal, hilar, or axillary lymph nodes. Thyroid gland, trachea, and esophagus demonstrate no significant findings. Lungs/Pleura: No pleural effusion, airspace consolidation, atelectasis or pneumothorax. No suspicious lung nodules identified. Upper Abdomen: No acute abnormality. Musculoskeletal: No chest wall abnormality. No  acute or significant osseous findings. Review of the MIP images confirms the above findings. IMPRESSION: 1. No evidence for acute pulmonary embolism. 2. 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 Electronically Signed   By: TKerby MoorsM.D.   On: 04/28/2022 17:57       PROCEDURES:  Critical Care performed: No  Procedures   MEDICATIONS ORDERED IN ED: Medications  iohexol (OMNIPAQUE) 350 MG/ML injection 75 mL (75 mLs Intravenous Contrast Given 04/28/22 1736)     IMPRESSION / MDM / ASSESSMENT AND PLAN / ED COURSE  I reviewed the triage vital signs and the nursing notes.                              Differential diagnosis includes, but is not limited to, pulmonary embolism, ACS, valvular abnormalities though no murmurs noted, myocarditis, endocarditis given his recent skin infection from a needle that became infected in the left arm, pericarditis, electrolyte abnormality, hypothyroidism etc.  Patient very stable, new onset atrial flutter.  CHA2DS2-VASc score is low.   Patient's presentation is most consistent with acute complicated illness / injury requiring diagnostic workup.  He has no evidence of ongoing infection at this time namely no fever no systemic or infectious symptoms, normal white count.  Given however that he did have an infection and is now having new onset atrial flutter I have ordered blood cultures to evaluate for the possibility of endocarditis though this seems highly unlikely but evidently he did have a needle that was in the left arm that became infected when his symptomatology started.  He reports primarily his concern at this time is irregular heart rhythm, slightly elevated from its baseline on his monitoring device, and also exertional dyspnea especially with heavy exertion such as running for 20 minutes  Findings on venous ultrasound reviewed no DVT.  Based on his symptomatology resolution of infectious findings and no  overlying skin findings of acute infection I clinically doubt he has an ongoing infection in the left arm and suspect he may have had a area that was abscessed, and perhaps is now resolving.  He is continue on cephalexin for an additional 24 hours.  Currently chest pain-free.  Labs reveal a low procalcitonin arguing against acute infection.  Minimally elevated BNP and an elevated troponin which given the patient's athletic build and lack of coronary artery disease are somewhat abnormal and surprising.  CT imaging of the chest to rule out PE is performed and negative for PE but some concern for potential pulmonary artery hypertension  The patient is on the cardiac monitor to evaluate for evidence of arrhythmia and/or significant heart rate changes.  Consulted with cardiology Dr. AMylo Red he advised that the patient may require cardioversion and TEE for further work-up.  He  advised that this could be done on an inpatient basis given the symptomatology, and I discussed with the patient options for both inpatient or outpatient care.  Patient has elected that he would like to be admitted for this which I think is quite reasonable as he does have evidence of potential elevated pulmonary pressures, elevated BNP and has an elevated troponin.  Currently resting pain-free understandable agreeable with plan.  Cardiology Dr. Mylo Red anticipates seeing the patient tomorrow and Eliquis started at his request   Admission discussed with hospitalist Dr. Damita Dunnings  FINAL CLINICAL IMPRESSION(S) / ED DIAGNOSES   Final diagnoses:  Typical atrial flutter ([place])  Elevated troponin     Rx / DC Orders   ED Discharge Orders     None        Note:  This document was prepared using Dragon voice recognition software and may include unintentional dictation errors.   Delman Kitten, MD 04/28/22 2326

## 2022-04-28 NOTE — ED Triage Notes (Signed)
Pt reports that he ran a triathlon on 9/17, got an infection to left arm and has been on abx, today went to [facility] and mentioned that he had been having this feeling of palpitations and having intermittent feeling of SOB. Pt in NAD. EKG done at [facility] showing that pt was in a-flutter.

## 2022-04-28 NOTE — H&P (Incomplete)
History and Physical    Patient: Terry Henson DQQ:[id number] DOB: August 02, 1976 DOA: 04/28/2022 DOS: the patient was seen and examined on 04/28/2022 PCP: Dion Body, MD  Patient coming from: Home  Chief Complaint:  Chief Complaint  Patient presents with   Shortness of Breath    HPI: Terry Henson is a 46 y.o. male with medical history significant for No significant past medical history who at baseline participates in triathlons who presents to the emergency room after he became short of breath after running for 20 minutes and after stopping at the fire department was told that he had a very elevated heart rate.  Patient states that a couple weeks prior he developed an infection in his left arm around the site of a heart monitor that was attached to his arm with a needle.  The infection cleared with Keflex which she completed the day prior.  He noted however that since he developed the infection he has been experiencing irregular heart rates and palpitations and over the past several days shortness of breath with exertion.  He denies chest pain.  Denies swelling in the arm or lower extremity pain or swelling. ED course and data review: BP 132/96 with otherwise normal vitals.  Labs significant for troponin of 27 and BNP 129.2.  TSH normal at 2.045.  Procalcitonin less than 0.10.  EKG, personally viewed and interpreted showing a flutter at 71 with no acute ST-T wave changes.  CTA chest showed no evidence of PE but showed increased caliber of the main pulmonary artery consistent with pulmonary artery hypertension.  Left upper extremity Doppler was negative for DVT but showed showed an ill-defined subcutaneous thickening of the posterior upper arm 2.5 x 2.6 x 1 cm, question cellulitis or developing abscess. The ED provider spoke with cardiologist on-call and the option for TEE cardioversion was discussed which was patient's preference.  Hospitalist consulted for admission.   Review of Systems: As  mentioned in the history of present illness. All other systems reviewed and are negative.  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 He has never used smokeless tobacco. He reports current alcohol use. He reports that he does not use drugs.  No Known Allergies  Family History  Problem Relation Age of Onset   Alcohol abuse Mother    Hyperlipidemia Mother    Hypertension Mother    Mental illness Mother    Alcohol abuse Father    Hyperlipidemia Father    Hypertension Father    Mental illness Father    Cancer Sister     Prior to Admission medications   Medication Sig Start Date End Date Taking? Authorizing Provider  cabergoline (DOSTINEX) 0.5 MG tablet Take 3 tablets by mouth 3 (three) times a week. 04/10/14  Yes [provider]  cefdinir (OMNICEF) 300 MG capsule Take 300 mg by mouth 2 (two) times daily. 04/25/22 05/05/22 Yes [provider]  doxycycline (VIBRAMYCIN) 100 MG capsule Take 100 mg by mouth 2 (two) times daily. 04/25/22 05/05/22 Yes [provider]  cephALEXin (KEFLEX) 500 MG capsule Take 500 mg by mouth 3 (three) times daily. Patient not taking: Reported on 04/28/2022 04/22/22   [provider]    Physical Exam: Vitals:   04/28/22 1601 04/28/22 1617 04/28/22 2004  BP:  (!) 132/96 119/88  Pulse:  75 75  Resp:  16 (!) 9  Temp:  98.1 F (36.7 C)   TempSrc:  Oral   SpO2:  100% 100%  Weight: 86 kg    Height: 6' (1.829 m)     Physical Exam  Labs on Admission: I have personally reviewed following labs and imaging studies  CBC: Recent Labs  Lab 04/28/22 1611  WBC 6.3  HGB 16.3  HCT 49.8  MCV 92.9  PLT 696   Basic Metabolic Panel: Recent Labs  Lab 04/28/22 1611  NA 139  K 4.5  CL 102  CO2 28  GLUCOSE 87  BUN 26*  CREATININE 1.08  CALCIUM 9.1  MG  2.4   GFR: Estimated Creatinine Clearance: 93.8 mL/min (by C-G formula based on SCr of 1.08 mg/dL). Liver Function Tests: No results for input(s): "AST", "ALT", "ALKPHOS", "BILITOT", "PROT", "ALBUMIN" in the last 168 hours. No results for input(s): "LIPASE", "AMYLASE" in the last 168 hours. No results for input(s): "AMMONIA" in the last 168 hours. Coagulation Profile: No results for input(s): "INR", "PROTIME" in the last 168 hours. Cardiac Enzymes: No results for input(s): "CKTOTAL", "CKMB", "CKMBINDEX", "TROPONINI" in the last 168 hours. BNP (last 3 results) No results for input(s): "PROBNP" in the last 8760 hours. HbA1C: No results for input(s): "HGBA1C" in the last 72 hours. CBG: No results for input(s): "GLUCAP" in the last 168 hours. Lipid Profile: No results for input(s): "CHOL", "HDL", "LDLCALC", "TRIG", "CHOLHDL", "LDLDIRECT" in the last 72 hours. Thyroid Function Tests: Recent Labs    04/28/22 1611  TSH 2.045   Anemia Panel: No results for input(s): "VITAMINB12", "FOLATE", "FERRITIN", "TIBC", "IRON", "RETICCTPCT" in the last 72 hours. Urine analysis: No results found for: "COLORURINE", "APPEARANCEUR", "LABSPEC", "PHURINE", "GLUCOSEU", "HGBUR", "BILIRUBINUR", "KETONESUR", "PROTEINUR", "UROBILINOGEN", "NITRITE", "LEUKOCYTESUR"  Radiological Exams on Admission: US Venous Img Upper Uni Left  Result Date: 04/28/2022 CLINICAL DATA:  Swelling in LEFT upper extremity, had an infection, question DVT EXAM: LEFT UPPER EXTREMITY VENOUS DOPPLER ULTRASOUND TECHNIQUE: Gray-scale sonography with graded compression, as well as color Doppler and duplex ultrasound were performed to evaluate the upper extremity deep venous system from the level of the subclavian vein and including the jugular, axillary, basilic, radial, ulnar and upper cephalic vein. Spectral Doppler was utilized to evaluate flow at rest and with distal augmentation maneuvers. COMPARISON:  None Available. FINDINGS:  Contralateral Subclavian Vein: Respiratory phasicity is normal and symmetric with the symptomatic side. No evidence of thrombus. Normal compressibility. Internal Jugular Vein: No evidence of thrombus. Normal compressibility, respiratory phasicity and response to augmentation. Subclavian Vein: No evidence of thrombus. Normal compressibility, respiratory phasicity and response to augmentation. Axillary Vein: No evidence of thrombus. Normal compressibility, respiratory phasicity and response to augmentation. Cephalic Vein: No evidence of thrombus. Normal compressibility, respiratory phasicity and response to augmentation. Basilic Vein: No evidence of thrombus. Normal compressibility, respiratory phasicity and response to augmentation. Brachial Veins: No evidence of thrombus. Normal compressibility, respiratory phasicity and response to augmentation. Radial Veins: No evidence of thrombus. Normal compressibility, respiratory phasicity and response to augmentation. Ulnar Veins: No evidence of thrombus. Normal compressibility, respiratory phasicity and response to augmentation. Venous Reflux:  None visualized. Other Findings: Ill-defined subcutaneous thickening and infiltration in the posterior upper arm, area approximately 2.5 x 2.6 x 1.0 cm in size. This could represent cellulitis or developing abscess but does not appear discretely loculated at this point. IMPRESSION: No evidence of DVT within the LEFT upper extremity. Ill-defined subcutaneous thickening and infiltration at posterior upper arm 2.5 x 2.6  x 1.0 cm in size, question cellulitis or developing abscess; this does not appear discretely loculated at this time. Electronically Signed   By: Lavonia Dana M.D.   On: 04/28/2022 18:02   CT Angio Chest PE W and/or Wo Contrast  Result Date: 04/28/2022 CLINICAL DATA:  Rule out acute pulmonary embolus. Palpitations and shortness of breath. Recent infection to left upper extremity. EXAM: CT ANGIOGRAPHY CHEST WITH  CONTRAST TECHNIQUE: Multidetector CT imaging of the chest was performed using the standard protocol during bolus administration of intravenous contrast. Multiplanar CT image reconstructions and MIPs were obtained to evaluate the vascular anatomy. RADIATION DOSE REDUCTION: This exam was performed according to the departmental dose-optimization program which includes automated exposure control, adjustment of the mA and/or kV according to patient size and/or use of iterative reconstruction technique. CONTRAST:  95m OMNIPAQUE IOHEXOL 350 MG/ML SOLN COMPARISON:  None Available. FINDINGS: Cardiovascular: Satisfactory opacification of the pulmonary arteries to the segmental level. No evidence of pulmonary embolism. Mild cardiac enlargement. No pericardial effusion. Increased caliber of the main pulmonary artery measures 3.8 cm consistent with PA hypertension. Mediastinum/Nodes: No enlarged mediastinal, hilar, or axillary lymph nodes. Thyroid gland, trachea, and esophagus demonstrate no significant findings. Lungs/Pleura: No pleural effusion, airspace consolidation, atelectasis or pneumothorax. No suspicious lung nodules identified. Upper Abdomen: No acute abnormality. Musculoskeletal: No chest wall abnormality. No acute or significant osseous findings. Review of the MIP images confirms the above findings. IMPRESSION: 1. No evidence for acute pulmonary embolism. 2. 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 Electronically Signed   By: TKerby MoorsM.D.   On: 04/28/2022 17:57     Data Reviewed: Relevant notes from primary care and specialist visits, past discharge summaries as available in EHR, including Care Everywhere. Prior diagnostic testing as pertinent to current admission diagnoses Updated medications and problem lists for reconciliation ED course, including vitals, labs, imaging, treatment and response to treatment Triage notes, nursing and pharmacy notes and ED  provider's notes Notable results as noted in HPI   Assessment and Plan: No notes have been filed under this hospital service. Service: Hospitalist       DVT prophylaxis: Eliquis  [facility]  Advance Care Planning:   Code Status: Not on file full  Family Communication: none  Disposition Plan: Back to previous home environment  Severity of Illness: The appropriate patient status for this patient is OBSERVATION. Observation status is judged to be reasonable and necessary in order to provide the required intensity of service to ensure the patient's safety. The patient's presenting symptoms, physical exam findings, and initial radiographic and laboratory data in the context of their medical condition is felt to place them at decreased risk for further clinical deterioration. Furthermore, it is anticipated that the patient will be medically stable for discharge from the hospital within 2 midnights of admission.   Author: HAthena Masse MD 04/28/2022 10:13 PM  For on call review [email or web address]

## 2022-04-29 ENCOUNTER — Encounter
Admission: EM | Disposition: A | Discharge status: Home / Self Care | Payer: Self-pay | Source: Home / Self Care | Attending: Emergency Medicine

## 2022-04-29 ENCOUNTER — Observation Stay: Payer: BC Managed Care – PPO | Admitting: Anesthesiology

## 2022-04-29 ENCOUNTER — Other Ambulatory Visit: Payer: Self-pay

## 2022-04-29 ENCOUNTER — Observation Stay (HOSPITAL_BASED_OUTPATIENT_CLINIC_OR_DEPARTMENT_OTHER)
Admit: 2022-04-29 | Discharge: 2022-04-29 | Disposition: A | Discharge status: Home / Self Care | Payer: BC Managed Care – PPO | Attending: Cardiovascular Disease | Admitting: Cardiovascular Disease

## 2022-04-29 ENCOUNTER — Encounter: Payer: Self-pay | Admitting: Internal Medicine

## 2022-04-29 ENCOUNTER — Ambulatory Visit: Admit: 2022-04-29 | Payer: BC Managed Care – PPO | Admitting: Cardiovascular Disease

## 2022-04-29 DIAGNOSIS — I4510 Unspecified right bundle-branch block: Secondary | ICD-10-CM | POA: Diagnosis not present

## 2022-04-29 DIAGNOSIS — R778 Other specified abnormalities of plasma proteins: Secondary | ICD-10-CM

## 2022-04-29 DIAGNOSIS — I4892 Unspecified atrial flutter: Secondary | ICD-10-CM

## 2022-04-29 DIAGNOSIS — D352 Benign neoplasm of pituitary gland: Secondary | ICD-10-CM

## 2022-04-29 DIAGNOSIS — Z79899 Other long term (current) drug therapy: Secondary | ICD-10-CM | POA: Diagnosis not present

## 2022-04-29 DIAGNOSIS — I340 Nonrheumatic mitral (valve) insufficiency: Secondary | ICD-10-CM

## 2022-04-29 DIAGNOSIS — I2721 Secondary pulmonary arterial hypertension: Secondary | ICD-10-CM

## 2022-04-29 DIAGNOSIS — R0602 Shortness of breath: Secondary | ICD-10-CM

## 2022-04-29 DIAGNOSIS — I272 Pulmonary hypertension, unspecified: Secondary | ICD-10-CM | POA: Diagnosis not present

## 2022-04-29 DIAGNOSIS — L03114 Cellulitis of left upper limb: Secondary | ICD-10-CM | POA: Diagnosis not present

## 2022-04-29 HISTORY — PX: CARDIOVERSION: SHX1299

## 2022-04-29 HISTORY — PX: TEE WITHOUT CARDIOVERSION: SHX5443

## 2022-04-29 LAB — BASIC METABOLIC PANEL
Anion gap: 6 (ref 5–15)
BUN: 19 mg/dL (ref 6–20)
CO2: 30 mmol/L (ref 22–32)
Calcium: 8.9 mg/dL (ref 8.9–10.3)
Chloride: 103 mmol/L (ref 98–111)
Creatinine, Ser: 1.01 mg/dL (ref 0.61–1.24)
GFR, Estimated: >60 mL/min (ref >60)
Glucose, Bld: 86 mg/dL (ref 70–99)
Potassium: 4.6 mmol/L (ref 3.5–5.1)
Sodium: 139 mmol/L (ref 135–145)

## 2022-04-29 LAB — PROTIME-INR
INR: 1.2 (ref 0.8–1.2)
Prothrombin Time: 15 seconds (ref 11.4–15.2)

## 2022-04-29 LAB — HIV ANTIBODY (ROUTINE TESTING W REFLEX): HIV Screen 4th Generation wRfx: NONREACTIVE

## 2022-04-29 LAB — MAGNESIUM: Magnesium: 2.3 mg/dL (ref 1.7–2.4)

## 2022-04-29 SURGERY — ECHOCARDIOGRAM, TRANSESOPHAGEAL
Anesthesia: General

## 2022-04-29 MED ORDER — APIXABAN 5 MG PO TABS: 5.0000 mg | ORAL_TABLET | Freq: Two times a day (BID) | ORAL | 0 refills | Status: DC

## 2022-04-29 MED ORDER — PROPOFOL 10 MG/ML IV BOLUS
INTRAVENOUS | Status: AC
  Filled 2022-04-29: qty 40

## 2022-04-29 MED ORDER — CABERGOLINE 0.5 MG PO TABS: 1.5000 mg | ORAL_TABLET | ORAL | Status: DC

## 2022-04-29 MED ORDER — PROPOFOL 10 MG/ML IV BOLUS
INTRAVENOUS | Status: DC | PRN
  Administered 2022-04-29 (×3): 20 mg via INTRAVENOUS
  Administered 2022-04-29: 120 mg via INTRAVENOUS
  Administered 2022-04-29 (×2): 20 mg via INTRAVENOUS

## 2022-04-29 MED ORDER — BUTAMBEN-TETRACAINE-BENZOCAINE 2-2-14 % EX AERO
INHALATION_SPRAY | CUTANEOUS | Status: AC
  Filled 2022-04-29: qty 5

## 2022-04-29 MED ORDER — SODIUM CHLORIDE 0.9 % IV SOLN
1.0000 g | INTRAVENOUS | Status: DC
  Administered 2022-04-29: 1 g via INTRAVENOUS
  Filled 2022-04-29: qty 10

## 2022-04-29 MED ORDER — LIDOCAINE HCL (PF) 2 % IJ SOLN
INTRAMUSCULAR | Status: AC
  Filled 2022-04-29: qty 5

## 2022-04-29 MED ORDER — MIDAZOLAM HCL 2 MG/2ML IJ SOLN
INTRAMUSCULAR | Status: AC
  Filled 2022-04-29: qty 2

## 2022-04-29 MED ORDER — LIDOCAINE VISCOUS HCL 2 % MT SOLN
OROMUCOSAL | Status: AC
  Filled 2022-04-29: qty 15

## 2022-04-29 MED ORDER — ONDANSETRON HCL 4 MG/2ML IJ SOLN: 4.0000 mg | Freq: Four times a day (QID) | INTRAMUSCULAR | Status: DC | PRN

## 2022-04-29 MED ORDER — SODIUM CHLORIDE FLUSH 0.9 % IV SOLN
INTRAVENOUS | Status: AC
  Filled 2022-04-29: qty 10

## 2022-04-29 MED ORDER — MIDAZOLAM HCL 2 MG/2ML IJ SOLN
INTRAMUSCULAR | Status: DC | PRN
  Administered 2022-04-29: 2 mg via INTRAVENOUS

## 2022-04-29 MED ORDER — ACETAMINOPHEN 325 MG PO TABS: 650.0000 mg | ORAL_TABLET | ORAL | Status: DC | PRN

## 2022-04-29 MED ORDER — PROPOFOL 10 MG/ML IV BOLUS
INTRAVENOUS | Status: AC
  Filled 2022-04-29: qty 20

## 2022-04-29 MED ORDER — SODIUM CHLORIDE 0.9 % IV SOLN: INTRAVENOUS | Status: DC

## 2022-04-29 NOTE — Anesthesia Postprocedure Evaluation (Signed)
Anesthesia Post Note  Patient: Terry Henson  Procedure(s) Performed: TRANSESOPHAGEAL ECHOCARDIOGRAM (TEE) CARDIOVERSION  Patient location during evaluation: Specials Recovery Anesthesia Type: General Level of consciousness: awake and alert Pain management: pain level controlled Vital Signs Assessment: post-procedure vital signs reviewed and stable Respiratory status: spontaneous breathing, nonlabored ventilation, respiratory function stable and patient connected to nasal cannula oxygen Cardiovascular status: blood pressure returned to baseline and stable Postop Assessment: no apparent nausea or vomiting Anesthetic complications: no   No notable events documented.   Last Vitals:  Vitals:   04/29/22 1335 04/29/22 1345  BP: 96/61 92/61  Pulse: 60 (!) 57  Resp: 13   Temp:    SpO2: 99% 97%    Last Pain:  Vitals:   04/29/22 1345  TempSrc:   PainSc: Asleep                 Arita Miss

## 2022-04-29 NOTE — Consult Note (Signed)
Cardiology Consultation   Patient ID: Terry Henson MRN: [id number]; DOB: 1976-06-22  Admit date: 04/28/2022 Date of Consult: 04/29/2022  PCP:  Dion Body, [facility] Cardiologist:  Nelva Bush, MD        Patient Profile:   Terry Henson is a 46 y.o. male with a hx of hyperprolactinemia who is being seen 04/29/2022 for the evaluation of new onset atrial flutter at the request of Dr. Damita Dunnings.  History of Present Illness:   Terry Henson is a 46 year old male with a history of hyperprolactinemia with no previous known cardiac history.  Patient is very athletic participating in marathons and swimming events.  He just recently flew back from an event in [place] as well . He reports proximately 2 weeks ago he had on a monitor which is being trialed by [employer] to his left arm.  After his swimming event he had noticed some discomfort and swelling to that area with redness and streaking.  He states that he had had chills for 2 nights prior to starting antibiotic therapy.   He was evaluated at urgent care for the abscess to the back of his left arm after having the device removed and been on antibiotic therapy.  During the time that his infection started he had also noted that he was getting irregular heart rate monitoring to include typically have higher heart rates than his normal during activity.  He had also started experiencing some palpitations that he described as someone blowing through his chest and some associated shortness of breath only on exertion.  None of the symptoms prevented him from completing any of his activities as far as working out with running on a treadmill and weightlifting.  Continues to deny chest pain, chest pressure, or swelling to his lower extremities.  He presented to the [facility] emergency department on 04/28/2022 with complaints of shortness of breath and palpitations.  He states that he was very short of breath when exerting  himself to the point he actually stopped at the fire department to have his vitals checked and was told that his heart rate was extremely high and he needed to be evaluated.  He continued to have some discomfort to the left upper extremity with a rope like feeling along the vein.  The monitoring disc had been removed from the left arm previously, no pain in the right upper extremity, no illness, or sick contacts  He had been on antibiotic therapy when he had 1 dose of the left to take.  When he arrived at the emergency department he was found to be in atrial flutter with a rate of 75. His resting heart rate is usually 42 bpm..  Initial vitals: Blood pressure 132/96, pulse of 75, respirations 16, temperature of 98.1  Pertinent labs: BUN 26, BNP 129.2, high-sensitivity troponin of 27, procalcitonin of less than 0.10.  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 Authorizing Provider  cabergoline (DOSTINEX) 0.5 MG tablet Take 3 tablets by mouth 3 (  three) times a week. 04/10/14  Yes [provider]  cefdinir (OMNICEF) 300 MG capsule Take 300 mg by mouth 2 (two) times daily. 04/25/22 05/05/22 Yes [provider]  doxycycline (VIBRAMYCIN) 100 MG capsule Take 100 mg by mouth 2 (two) times daily. 04/25/22 05/05/22 Yes [provider]  cephALEXin (KEFLEX) 500 MG capsule Take 500 mg by mouth 3 (three) times daily. Patient not taking: Reported on 04/28/2022 04/22/22   [provider]    Inpatient Medications: Scheduled  Meds:  apixaban  5 mg Oral BID   [START ON 04/30/2022] cabergoline  1.5 mg Oral Once per day on Mon Wed Fri   Continuous Infusions:  cefTRIAXone (ROCEPHIN)  IV Stopped (04/29/22 0700)   PRN Meds: acetaminophen, ondansetron (ZOFRAN) IV  Allergies:   No Known Allergies  Social History:   Social History   Socioeconomic History   Marital status: Single    Spouse name: Not on file   Number of children: Not on file   Years of education: Not on file   Highest education level: Not on file  Occupational History   Not on file  Tobacco Use   Smoking status: Never   Smokeless tobacco: Never  Substance and Sexual Activity   Alcohol use: Yes   Drug use: No   Sexual activity: Not on file  Other Topics Concern   Not on file  Social History Narrative   ** Merged History Encounter **       Social Determinants of Health   Financial Resource Strain: Not on file  Food Insecurity: Not on file  Transportation Needs: Not on file  Physical Activity: Not on file  Stress: Not on file  Social Connections: Not on file  Intimate Partner Violence: Not on file    Family History:    Family History  Problem Relation Age of Onset   Alcohol abuse Mother    Hyperlipidemia Mother    Hypertension Mother    Mental illness Mother    Alcohol abuse Father    Hyperlipidemia Father    Hypertension Father    Mental illness Father    Cancer Sister      ROS:  Please see the history of present illness.  Review of Systems  Constitutional:  Positive for chills.  Respiratory:  Positive for shortness of breath.   Cardiovascular:  Positive for palpitations.    All other ROS reviewed and negative.     Physical Exam/Data:   Vitals:   04/29/22 0830 04/29/22 0833 04/29/22 0854 04/29/22 0855  BP:    122/87  Pulse:    60  Resp: (!) 23 (!) 23 (!) 22 17  Temp:    98.2 F (36.8 C)  TempSrc:    Oral  SpO2:    100%  Weight:      Height:        Intake/Output Summary (Last 24 hours) at 04/29/2022  1019 Last data filed at 04/29/2022 0700 Gross per 24 hour  Intake 100 ml  Output --  Net 100 ml      04/28/2022    4:01 PM 04/26/2020    9:56 AM 12/15/2019    2:04 PM  Last 3 Weights  Weight (lbs) 189 lb 9.5 oz 190 lb 190 lb  Weight (kg) 86 kg 86.183 kg 86.183 kg     Body mass index is 25.71 kg/m.  General:  Well nourished, well developed, in no acute distress HEENT: normal Neck: no JVD Vascular: No carotid bruits; Distal pulses  2+ bilaterally Cardiac:  normal S1, S2; RRR; no murmur  Lungs:  clear to auscultation bilaterally, no wheezing, rhonchi or rales, respirations are unlabored at rest on room air Abd: soft, nontender, no hepatomegaly  Ext: no edema Musculoskeletal:  No deformities, BUE and BLE strength normal and equal Skin: warm and dry, raised area noted o the back of the left arm where previous abscess  Neuro:  CNs 2-12 intact, no focal abnormalities noted Psych:  Normal affect   EKG:  The EKG was personally reviewed and demonstrates:  atrial flutter with varying AV block Telemetry:  Telemetry was personally reviewed and demonstrates:  atrial flutter rates of 50-60  Relevant CV Studies:   Laboratory Data:  High Sensitivity Troponin:   Recent Labs  Lab 04/28/22 1611  TROPONINIHS 27*     Chemistry Recent Labs  Lab 04/28/22 1611  NA 139  K 4.5  CL 102  CO2 28  GLUCOSE 87  BUN 26*  CREATININE 1.08  CALCIUM 9.1  MG 2.4  GFRNONAA >60  ANIONGAP 9    No results for input(s): "PROT", "ALBUMIN", "AST", "ALT", "ALKPHOS", "BILITOT" in the last 168 hours. Lipids No results for input(s): "CHOL", "TRIG", "HDL", "LABVLDL", "LDLCALC", "CHOLHDL" in the last 168 hours.  Hematology Recent Labs  Lab 04/28/22 1611  WBC 6.3  RBC 5.36  HGB 16.3  HCT 49.8  MCV 92.9  MCH 30.4  MCHC 32.7  RDW 12.3  PLT 286   Thyroid  Recent Labs  Lab 04/28/22 1611  TSH 2.045    BNP Recent Labs  Lab 04/28/22 1611  BNP 129.2*    DDimer No results for input(s): "DDIMER"  in the last 168 hours.   Radiology/Studies:  US Venous Img Upper Uni Left  Result Date: 04/28/2022 CLINICAL DATA:  Swelling in LEFT upper extremity, had an infection, question DVT EXAM: LEFT UPPER EXTREMITY VENOUS DOPPLER ULTRASOUND TECHNIQUE: Gray-scale sonography with graded compression, as well as color Doppler and duplex ultrasound were performed to evaluate the upper extremity deep venous system from the level of the subclavian vein and including the jugular, axillary, basilic, radial, ulnar and upper cephalic vein. Spectral Doppler was utilized to evaluate flow at rest and with distal augmentation maneuvers. COMPARISON:  None Available. FINDINGS: Contralateral Subclavian Vein: Respiratory phasicity is normal and symmetric with the symptomatic side. No evidence of thrombus. Normal compressibility. Internal Jugular Vein: No evidence of thrombus. Normal compressibility, respiratory phasicity and response to augmentation. Subclavian Vein: No evidence of thrombus. Normal compressibility, respiratory phasicity and response to augmentation. Axillary Vein: No evidence of thrombus. Normal compressibility, respiratory phasicity and response to augmentation. Cephalic Vein: No evidence of thrombus. Normal compressibility, respiratory phasicity and response to augmentation. Basilic Vein: No evidence of thrombus. Normal compressibility, respiratory phasicity and response to augmentation. Brachial Veins: No evidence of thrombus. Normal compressibility, respiratory phasicity and response to augmentation. Radial Veins: No evidence of thrombus. Normal compressibility, respiratory phasicity and response to augmentation. Ulnar Veins: No evidence of thrombus. Normal compressibility, respiratory phasicity and response to augmentation. Venous Reflux:  None visualized. Other Findings: Ill-defined subcutaneous thickening and infiltration in the posterior upper arm, area approximately 2.5 x 2.6 x 1.0 cm in size. This could  represent cellulitis or developing abscess but does not appear discretely loculated at this point. IMPRESSION: No evidence of DVT within the LEFT upper extremity. Ill-defined subcutaneous thickening and infiltration at posterior upper arm 2.5 x 2.6 x 1.0 cm in size, question cellulitis or developing abscess; this does not appear discretely loculated  at this time. Electronically Signed   By: Lavonia Dana M.D.   On: 04/28/2022 18:02   CT Angio Chest PE W and/or Wo Contrast  Result Date: 04/28/2022 CLINICAL DATA:  Rule out acute pulmonary embolus. Palpitations and shortness of breath. Recent infection to left upper extremity. EXAM: CT ANGIOGRAPHY CHEST WITH CONTRAST TECHNIQUE: Multidetector CT imaging of the chest was performed using the standard protocol during bolus administration of intravenous contrast. Multiplanar CT image reconstructions and MIPs were obtained to evaluate the vascular anatomy. RADIATION DOSE REDUCTION: This exam was performed according to the departmental dose-optimization program which includes automated exposure control, adjustment of the mA and/or kV according to patient size and/or use of iterative reconstruction technique. CONTRAST:  19m OMNIPAQUE IOHEXOL 350 MG/ML SOLN COMPARISON:  None Available. FINDINGS: Cardiovascular: Satisfactory opacification of the pulmonary arteries to the segmental level. No evidence of pulmonary embolism. Mild cardiac enlargement. No pericardial effusion. Increased caliber of the main pulmonary artery measures 3.8 cm consistent with PA hypertension. Mediastinum/Nodes: No enlarged mediastinal, hilar, or axillary lymph nodes. Thyroid gland, trachea, and esophagus demonstrate no significant findings. Lungs/Pleura: No pleural effusion, airspace consolidation, atelectasis or pneumothorax. No suspicious lung nodules identified. Upper Abdomen: No acute abnormality. Musculoskeletal: No chest wall abnormality. No acute or significant osseous findings. Review of the  MIP images confirms the above findings. IMPRESSION: 1. No evidence for acute pulmonary embolism. 2. 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 Electronically Signed   By: TKerby MoorsM.D.   On: 04/28/2022 17:57     Assessment and Plan:   New onset atrial flutter -recent travel and abscess to the back of the left upper arm, likely cause of undue stress on the heart -Left arm ultrasound negative for DVT -remains rate controlled -denies any current shortness of breath or palpitations -EKG and cardiac monitor reveals atrial flutter -long discussion about causes and treatment options -started on eliquis 5 mg bid in the ED -scheduled for TEE/DCCV later this afternoon -continue cardiac monitor -Transthoracic echocardiogram is being held off until outpatient setting when patient is hopefully in regular rhythm -If direct-current cardioversion is unsuccessful he will need referral to electrophysiology for further work-up and management of atrial fibrillation  2.   Elevated high-sensitivity troponin -Troponin of 27 -Suspect demand ischemia from atrial flutter -Currently has remained chest pain-free -No changes noted on EKG or telemetry since arrival  3.    Cellulitis of the left arm -Left upper extremity Doppler negative for DVT but shows area that is questionable for cellulitis with developing abscess -Keflex was replaced with Rocephin per IM -Blood cultures are pending -Management and treatment per IM  4.    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 -Continues on cabergoline and testosterone -Followed by endocrinology  Shared Decision Making/Informed Consent The risks [stroke, cardiac arrhythmias rarely resulting in the need for a temporary or permanent pacemaker, skin irritation or burns, esophageal damage, perforation (1:10,000 risk), bleeding, pharyngeal hematoma as well as other potential complications associated  with conscious sedation including aspiration, arrhythmia, respiratory failure and death], benefits (treatment guidance, restoration of normal sinus rhythm, diagnostic support) and alternatives of a transesophageal echocardiogram guided cardioversion were discussed in detail with Mr. STangumaand he is willing to proceed.     Risk Assessment/Risk Scores:                For questions or updates, please contact CPeninsulaPlease consult [email or web address] for contact info under  Signed, Zeppelin Beckstrand, NP  04/29/2022 10:19 AM

## 2022-04-29 NOTE — Assessment & Plan Note (Signed)
Can consider pulmonology outpatient referral

## 2022-04-29 NOTE — CV Procedure (Signed)
Cardioversion procedure note For atrial flutter with rapid ventricular rate  Procedure Details:  Consent: Risks of procedure as well as the alternatives and risks of each were explained to the (patient/caregiver).  Consent for procedure obtained.  Time Out: Verified patient identification, verified procedure, site/side was marked, verified correct patient position, special equipment/implants available, medications/allergies/relevent history reviewed, required imaging and test results available.  Performed  Patient placed on cardiac monitor, pulse oximetry, supplemental oxygen as necessary.   Sedation given: propofol IV, Dr. Bertell Maria Pacer pads placed anterior and posterior chest.   Cardioverted 1 time(s).   Cardioverted at  120 J. Synchronized biphasic Converted to NSR   Evaluation: Findings: Post procedure EKG shows: NSR Complications: None Patient did tolerate procedure well.  Time Spent Directly with the Patient:  75 minutes   Esmond Plants, M.D., Ph.D.

## 2022-04-29 NOTE — Assessment & Plan Note (Signed)
Patient is currently rate controlled Continue Eliquis started in the ED Echocardiogram in the a.m. Cardiology consulted from the ED for consideration of TEE cardioversion in the a.m. which is patient's preference

## 2022-04-29 NOTE — H&P (Signed)
H&P Addendum, pre-cardioversion  Patient was seen and evaluated prior to TEE and cardioversion procedure Symptoms, prior testing details again confirmed with the patient Patient examined, no significant change from prior exam Lab work reviewed in detail personally by myself Patient understands risk and benefit of the procedure,  After careful review of history and examination, the risks and benefits of transesophageal echocardiogram have been explained including risks of esophageal damage, perforation (1:10,000 risk), bleeding, pharyngeal hematoma as well as other potential complications associated with conscious sedation including aspiration, arrhythmia, respiratory failure and death. Alternatives to treatment were discussed, questions were answered. Patient is willing to proceed.   Patient willing to proceed.  Signed, Esmond Plants, MD, Ph.D [facility]

## 2022-04-29 NOTE — Assessment & Plan Note (Signed)
History of pituitary microadenoma 5 cm diagnosed in 2012 On cabergoline and testosterone.  Followed by endocrinology

## 2022-04-29 NOTE — Progress Notes (Signed)
Transesophageal Echocardiogram :  Indication: Atrial flutter Requesting/ordering  physician: Dr. Judd Gaudier  Procedure: Benzocaine spray x2 and 2 mls x 2 of viscous lidocaine were given orally to provide local anesthesia to the oropharynx. The patient was positioned supine on the left side, bite block provided. The patient was moderately sedated with the doses of versed and fentanyl as detailed below.  Using digital technique an omniplane probe was advanced into the distal esophagus without incident.   Moderate sedation: 1. Sedation used: Propofol per anesthesia  See report in EPIC  for complete details: In brief, transgastric imaging revealed normal LV function with no RWMAs and no mural apical thrombus.  .  Estimated ejection fraction was 50%.  Right sided cardiac chambers were had low normal function with no evidence of pulmonary hypertension.  Imaging of the septum showed no ASD or VSD Bubble study was negative for shunt 2D and color flow confirmed no PFO  The LA was well visualized in orthogonal views.  There was no spontaneous contrast and no thrombus in the LA and LA appendage   The descending thoracic aorta had no  mural aortic debris with no evidence of aneurysmal dilation or disection   Terry Henson 04/29/2022 1:33 PM

## 2022-04-29 NOTE — Interval H&P Note (Signed)
History and Physical Interval Note:  04/29/2022 1:32 PM  Terry Henson  has presented today for surgery, with the diagnosis of new onset atrial flutter.  The various methods of treatment have been discussed with the patient and family. After consideration of risks, benefits and other options for treatment, the patient has consented to  Procedure(s): TRANSESOPHAGEAL ECHOCARDIOGRAM (TEE) (N/A) CARDIOVERSION (N/A) as a surgical intervention.  The patient's history has been reviewed, patient examined, no change in status, stable for surgery.  I have reviewed the patient's chart and labs.  Questions were answered to the patient's satisfaction.     Ida Rogue

## 2022-04-29 NOTE — Assessment & Plan Note (Signed)
Troponin 27 with BNP 129.2 Suspect demand ischemia from suspect an episode of rapid a flutter Follow echocardiogram

## 2022-04-29 NOTE — Assessment & Plan Note (Addendum)
Left upper extremity Doppler negative for DVT but shows "Ill-defined subcutaneous thickening and infiltration at posterior upper arm 2.5 x 2.6 x 1.0 cm in size, question cellulitis or developing abscess" Will replace Keflex with Rocephin as currently n.p.o. for possible TEE in the a.m. Follow blood cultures

## 2022-04-29 NOTE — Transfer of Care (Signed)
Immediate Anesthesia Transfer of Care Note  Patient: Terry Henson  Procedure(s) Performed: TRANSESOPHAGEAL ECHOCARDIOGRAM (TEE) CARDIOVERSION  Patient Location: PACU and Cath Lab  Anesthesia Type:General  Level of Consciousness: drowsy  Airway & Oxygen Therapy: Patient Spontanous Breathing and Patient connected to nasal cannula oxygen  Post-op Assessment: Report given to RN and Post -op Vital signs reviewed and stable  Post vital signs: Reviewed and stable  Last Vitals:  Vitals Value Taken Time  BP 92/55 04/29/22 1333  Temp 35.9 1335  Pulse 58 04/29/22 1334  Resp 14 04/29/22 1334  SpO2 100 % 04/29/22 1334  Vitals shown include unvalidated device data.  Last Pain:  Vitals:   04/29/22 0855  TempSrc: Oral  PainSc:          Complications: No notable events documented.

## 2022-04-29 NOTE — Anesthesia Preprocedure Evaluation (Signed)
Anesthesia Evaluation  Patient identified by MRN, date of birth, ID band Patient awake    Reviewed: Allergy & Precautions, NPO status , Patient's Chart, lab work & pertinent test results  History of Anesthesia Complications Negative for: history of anesthetic complications  Airway Mallampati: I  TM Distance: >3 FB Neck ROM: Full    Dental no notable dental hx. (+) Teeth Intact   Pulmonary neg pulmonary ROS, neg sleep apnea, neg COPD, Patient abstained from smoking.Not current smoker,    Pulmonary exam normal breath sounds clear to auscultation       Cardiovascular Exercise Tolerance: Good METS: > 9 Mets (-) hypertension(-) CAD and (-) Past MI + dysrhythmias Atrial Fibrillation  Rhythm:Irregular Rate:Normal - Systolic murmurs Marathon runner   Neuro/Psych Pituitary adenoma, no mass effect. Takes cabergoline and testosterone. negative neurological ROS  negative psych ROS   GI/Hepatic neg GERD  ,(+)     (-) substance abuse  ,   Endo/Other  neg diabetes  Renal/GU negative Renal ROS     Musculoskeletal   Abdominal   Peds  Hematology   Anesthesia Other Findings Past Medical History: No date: Macroprolactinoma [facility])  Reproductive/Obstetrics                             Anesthesia Physical Anesthesia Plan  ASA: 2  Anesthesia Plan: General   Post-op Pain Management: Minimal or no pain anticipated   Induction: Intravenous  PONV Risk Score and Plan: 2 and Propofol infusion, TIVA, Ondansetron and Midazolam  Airway Management Planned: Nasal Cannula and Natural Airway  Additional Equipment: None  Intra-op Plan:   Post-operative Plan:   Informed Consent: I have reviewed the patients History and Physical, chart, labs and discussed the procedure including the risks, benefits and alternatives for the proposed anesthesia with the patient or authorized representative who has indicated his/her  understanding and acceptance.     Dental advisory given  Plan Discussed with: CRNA and Surgeon  Anesthesia Plan Comments: (Discussed risks of anesthesia with patient, including possibility of difficulty with spontaneous ventilation under anesthesia necessitating airway intervention, PONV, and rare risks such as cardiac or respiratory or neurological events, and allergic reactions. Discussed the role of CRNA in patient's perioperative care. Patient understands.)        Anesthesia Quick Evaluation

## 2022-04-29 NOTE — Discharge Summary (Signed)
Physician Discharge Summary   Patient: Terry Henson MRN: [id number] DOB: 1975/09/07  Admit date:     04/28/2022  Discharge date: 04/29/22  Discharge Physician: Berle Mull  PCP: Dion Body, MD  Recommendations at discharge: Follow-up with cardiology as recommended.   Follow-up Information     Dion Body, MD. Call.   Specialty: Family Medicine Why: As needed Contact information: [address] [place] [address] [phone]         Nelva Bush, MD .   Specialty: Cardiology Why: The office will reach out to you for follow-up. Contact information: [place] [address] [phone]                Discharge Diagnoses: Principal Problem:   New onset atrial flutter (HCC) Active Problems:   Cellulitis of left arm   Elevated troponin   Pituitary adenoma (HCC)   Pulmonary artery hypertension on CT (HCC)   SOB (shortness of breath)  Assessment and Plan  New onset atrial flutter ([place]) Patient is currently rate controlled Started on Eliquis in the ED. Cardiology was consulted. Underwent TEE and cardioversion with conversion to normal sinus rhythm. Cardiology recommended the patient admission after discharge from their point of view. No indication rate control medication on discharge for now.   Elevated troponin Troponin 27 with BNP 129.2 Suspect demand ischemia from suspect an episode of rapid a flutter Management per cardiology.  Cellulitis of left arm Left upper extremity Doppler negative for DVT but shows "Ill-defined subcutaneous thickening and infiltration at posterior upper arm 2.5 x 2.6 x 1.0 cm in size, question cellulitis or developing abscess" Continue home regimen for now.  Pulmonary artery hypertension on CT [facility]) Can consider pulmonology outpatient referral   Pituitary adenoma [facility]) History of pituitary microadenoma 5 cm diagnosed in 2012 On cabergoline and  testosterone.  Followed by endocrinology   Consultants:  Cardiology   Procedures performed:  TEE and cardioversion  DISCHARGE MEDICATION: Allergies as of 04/29/2022   No Known Allergies      Medication List     STOP taking these medications    cephALEXin 500 MG capsule Commonly known as: KEFLEX       TAKE these medications    apixaban 5 MG Tabs tablet Commonly known as: ELIQUIS Take 1 tablet (5 mg total) by mouth 2 (two) times daily.   cabergoline 0.5 MG tablet Commonly known as: DOSTINEX Take 3 tablets by mouth 3 (three) times a week.   cefdinir 300 MG capsule Commonly known as: OMNICEF Take 300 mg by mouth 2 (two) times daily.   doxycycline 100 MG capsule Commonly known as: VIBRAMYCIN Take 100 mg by mouth 2 (two) times daily.       Disposition: Home Diet recommendation: Regular diet  Discharge Exam: Vitals:   04/29/22 1500 04/29/22 1515 04/29/22 1530 04/29/22 1545  BP: 111/81  106/77   Pulse: (!) 51 (!) 51 (!) 51 (!) 48  Resp: '15 15 18 15  '$ Temp:      TempSrc:      SpO2: 100% 100% 100% 100%  Weight:      Height:       General: Appear in mild distress; no visible Abnormal Neck Mass Or lumps, Conjunctiva normal Cardiovascular: S1 and S2 Present, no Murmur, Respiratory: good respiratory effort, Bilateral Air entry present and CTA, no Crackles, no wheezes Abdomen: Bowel Sound present, Non tender Extremities: no Pedal edema Neurology: alert and oriented to Self, Place and  time.  Filed Weights   04/28/22 1601  Weight: 86 kg   Condition at discharge: stable  The results of significant diagnostics from this hospitalization (including imaging, microbiology, ancillary and laboratory) are listed below for reference.   Imaging Studies: ECHO TEE  Result Date: 04/29/2022    TRANSESOPHOGEAL ECHO REPORT   Patient Name:   Terry Henson Date of Exam: 04/29/2022 Medical Rec #:  [id number]      Height:       72.0 in Accession #:    [id number]     Weight:        189.6 lb Date of Birth:  1976/01/03      BSA:          2.083 m Patient Age:    46 years       BP:           120/75 mmHg Patient Gender: M              HR:           191 bpm. Exam Location:  [facility] Procedure: Transesophageal Echo, Cardiac Doppler, Color Doppler and Saline            Contrast Bubble Study Indications:     Atrial Flutter I48.92  History:         Patient has no prior history of Echocardiogram examinations.                  New onset Aflutter.  Sonographer:     Sherrie Sport Referring Phys:  [place] Diagnosing Phys: Ida Rogue MD PROCEDURE: After discussion of the risks and benefits of a TEE, an informed consent was obtained from the patient. TEE procedure time was 25 minutes. The transesophogeal probe was passed without difficulty through the esophogus of the patient. Imaged were obtained with the patient in a left lateral decubitus position. Local oropharyngeal anesthetic was provided with Cetacaine and viscous lidocaine. Sedation performed by different physician. Image quality was excellent. The patient's vital signs; including heart rate, blood pressure, and oxygen saturation; remained stable throughout the procedure. The patient developed no complications during the procedure. A successful direct current cardioversion was performed at 120 joules with 1 attempt. IMPRESSIONS  1. Left ventricular ejection fraction, by estimation, is 50 to 55%. The left ventricle has low normal function. The left ventricle has no regional wall motion abnormalities.  2. Right ventricular systolic function is low normal. The right ventricular size is normal.  3. No left atrial/left atrial appendage thrombus was detected.  4. The mitral valve is normal in structure. Mild mitral valve regurgitation. No evidence of mitral stenosis.  5. The aortic valve is normal in structure. Aortic valve regurgitation is not visualized. No aortic stenosis is present.  6. The inferior vena cava is normal in size with greater  than 50% respiratory variability, suggesting right atrial pressure of 3 mmHg.  7. Agitated saline contrast bubble study was negative, with no evidence of any interatrial shunt. Conclusion(s)/Recommendation(s): Normal biventricular function without evidence of hemodynamically significant valvular heart disease. FINDINGS  Left Ventricle: Left ventricular ejection fraction, by estimation, is 50 to 55%. The left ventricle has low normal function. The left ventricle has no regional wall motion abnormalities. The left ventricular internal cavity size was normal in size. There is no left ventricular hypertrophy. Right Ventricle: The right ventricular size is normal. No increase in right ventricular wall thickness. Right ventricular systolic function is low normal. Left Atrium: Left atrial size was  normal in size. No left atrial/left atrial appendage thrombus was detected. Right Atrium: Right atrial size was normal in size. Pericardium: There is no evidence of pericardial effusion. Mitral Valve: The mitral valve is normal in structure. Mild mitral valve regurgitation. No evidence of mitral valve stenosis. Tricuspid Valve: The tricuspid valve is normal in structure. Tricuspid valve regurgitation is not demonstrated. No evidence of tricuspid stenosis. Aortic Valve: The aortic valve is normal in structure. Aortic valve regurgitation is not visualized. No aortic stenosis is present. Pulmonic Valve: The pulmonic valve was normal in structure. Pulmonic valve regurgitation is not visualized. No evidence of pulmonic stenosis. Aorta: The aortic root is normal in size and structure. Venous: The inferior vena cava is normal in size with greater than 50% respiratory variability, suggesting right atrial pressure of 3 mmHg. IAS/Shunts: No atrial level shunt detected by color flow Doppler. Agitated saline contrast was given intravenously to evaluate for intracardiac shunting. Agitated saline contrast bubble study was negative, with no  evidence of any interatrial shunt. There  is no evidence of a patent foramen ovale. There is no evidence of an atrial septal defect. Ida Rogue MD Electronically signed by Ida Rogue MD Signature Date/Time: 04/29/2022/1:35:56 PM    Final    US Venous Img Upper Uni Left  Result Date: 04/28/2022 CLINICAL DATA:  Swelling in LEFT upper extremity, had an infection, question DVT EXAM: LEFT UPPER EXTREMITY VENOUS DOPPLER ULTRASOUND TECHNIQUE: Gray-scale sonography with graded compression, as well as color Doppler and duplex ultrasound were performed to evaluate the upper extremity deep venous system from the level of the subclavian vein and including the jugular, axillary, basilic, radial, ulnar and upper cephalic vein. Spectral Doppler was utilized to evaluate flow at rest and with distal augmentation maneuvers. COMPARISON:  None Available. FINDINGS: Contralateral Subclavian Vein: Respiratory phasicity is normal and symmetric with the symptomatic side. No evidence of thrombus. Normal compressibility. Internal Jugular Vein: No evidence of thrombus. Normal compressibility, respiratory phasicity and response to augmentation. Subclavian Vein: No evidence of thrombus. Normal compressibility, respiratory phasicity and response to augmentation. Axillary Vein: No evidence of thrombus. Normal compressibility, respiratory phasicity and response to augmentation. Cephalic Vein: No evidence of thrombus. Normal compressibility, respiratory phasicity and response to augmentation. Basilic Vein: No evidence of thrombus. Normal compressibility, respiratory phasicity and response to augmentation. Brachial Veins: No evidence of thrombus. Normal compressibility, respiratory phasicity and response to augmentation. Radial Veins: No evidence of thrombus. Normal compressibility, respiratory phasicity and response to augmentation. Ulnar Veins: No evidence of thrombus. Normal compressibility, respiratory phasicity and response to  augmentation. Venous Reflux:  None visualized. Other Findings: Ill-defined subcutaneous thickening and infiltration in the posterior upper arm, area approximately 2.5 x 2.6 x 1.0 cm in size. This could represent cellulitis or developing abscess but does not appear discretely loculated at this point. IMPRESSION: No evidence of DVT within the LEFT upper extremity. Ill-defined subcutaneous thickening and infiltration at posterior upper arm 2.5 x 2.6 x 1.0 cm in size, question cellulitis or developing abscess; this does not appear discretely loculated at this time. Electronically Signed   By: Lavonia Dana M.D.   On: 04/28/2022 18:02   CT Angio Chest PE W and/or Wo Contrast  Result Date: 04/28/2022 CLINICAL DATA:  Rule out acute pulmonary embolus. Palpitations and shortness of breath. Recent infection to left upper extremity. EXAM: CT ANGIOGRAPHY CHEST WITH CONTRAST TECHNIQUE: Multidetector CT imaging of the chest was performed using the standard protocol during bolus administration of intravenous contrast. Multiplanar CT image reconstructions and  MIPs were obtained to evaluate the vascular anatomy. RADIATION DOSE REDUCTION: This exam was performed according to the departmental dose-optimization program which includes automated exposure control, adjustment of the mA and/or kV according to patient size and/or use of iterative reconstruction technique. CONTRAST:  40m OMNIPAQUE IOHEXOL 350 MG/ML SOLN COMPARISON:  None Available. FINDINGS: Cardiovascular: Satisfactory opacification of the pulmonary arteries to the segmental level. No evidence of pulmonary embolism. Mild cardiac enlargement. No pericardial effusion. Increased caliber of the main pulmonary artery measures 3.8 cm consistent with PA hypertension. Mediastinum/Nodes: No enlarged mediastinal, hilar, or axillary lymph nodes. Thyroid gland, trachea, and esophagus demonstrate no significant findings. Lungs/Pleura: No pleural effusion, airspace consolidation,  atelectasis or pneumothorax. No suspicious lung nodules identified. Upper Abdomen: No acute abnormality. Musculoskeletal: No chest wall abnormality. No acute or significant osseous findings. Review of the MIP images confirms the above findings. IMPRESSION: 1. No evidence for acute pulmonary embolism. 2. 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 Electronically Signed   By: TKerby MoorsM.D.   On: 04/28/2022 17:57    Microbiology: Results for orders placed or performed during the hospital encounter of 04/28/22  Blood culture (routine x 2)     Status: None (Preliminary result)   Collection Time: 04/28/22  4:45 PM   Specimen: BLOOD  Result Value Ref Range Status   Specimen Description BLOOD BLOOD RIGHT FOREARM  Final   Special Requests   Final    BOTTLES DRAWN AEROBIC AND ANAEROBIC Blood Culture results may not be optimal due to an inadequate volume of blood received in culture bottles   Culture   Final    NO GROWTH < 12 HOURS [facility] [address], [place] [address]   Report Status PENDING  Incomplete  Blood culture (routine x 2)     Status: None (Preliminary result)   Collection Time: 04/28/22  4:50 PM   Specimen: BLOOD  Result Value Ref Range Status   Specimen Description BLOOD BLOOD RIGHT WRIST  Final   Special Requests   Final    BOTTLES DRAWN AEROBIC AND ANAEROBIC Blood Culture results may not be optimal due to an inadequate volume of blood received in culture bottles   Culture   Final    NO GROWTH < 12 HOURS [facility] [address], [place] [address]   Report Status PENDING  Incomplete   Labs: CBC: Recent Labs  Lab 04/28/22 1611  WBC 6.3  HGB 16.3  HCT 49.8  MCV 92.9  PLT 2929  Basic Metabolic Panel: Recent Labs  Lab 04/28/22 1611 04/29/22 1031  NA 139 139  K 4.5 4.6  CL 102 103  CO2 28 30  GLUCOSE 87 86  BUN 26* 19  CREATININE 1.08 1.01  CALCIUM  9.1 8.9  MG 2.4 2.3   Liver Function Tests: No results for input(s): "AST", "ALT", "ALKPHOS", "BILITOT", "PROT", "ALBUMIN" in the last 168 hours. CBG: No results for input(s): "GLUCAP" in the last 168 hours.  Discharge time spent: greater than 30 minutes.  Signed: PBerle Mull MD [facility]

## 2022-04-29 NOTE — Progress Notes (Signed)
*  PRELIMINARY RESULTS* Echocardiogram 2D Echocardiogram has been performed.  Terry Henson 04/29/2022, 1:26 PM

## 2022-04-30 ENCOUNTER — Encounter: Payer: Self-pay | Admitting: Cardiovascular Disease

## 2022-05-03 LAB — CULTURE, BLOOD (ROUTINE X 2)
Culture: NO GROWTH
Culture: NO GROWTH

## 2022-05-14 NOTE — Progress Notes (Signed)
Cardiology Clinic Note   Patient Name: Terry Henson Date of Encounter: 05/15/2022  Primary Care Provider:  Dion Body, MD Primary Cardiologist:  Nelva Bush, MD  Patient Profile    46 year old male with a history of hyperprolactinemia and new onset atrial flutter, who is here today for hospital follow-up.   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[facility])    Past Surgical History:  Procedure Laterality Date   CARDIOVERSION N/A 04/29/2022   Procedure: CARDIOVERSION;  Surgeon: Minna Merritts, MD;  Location: [facility] ORS;  Service: Cardiovascular;  Laterality: N/A;   TEE WITHOUT CARDIOVERSION N/A 04/29/2022   Procedure: TRANSESOPHAGEAL ECHOCARDIOGRAM (TEE);  Surgeon: Minna Merritts, MD;  Location: [facility] ORS;  Service: Cardiovascular;  Laterality: N/A;   VASECTOMY  03/2004    Allergies  No Known Allergies  History of Present Illness    This is a 46 year old male with hyperprolactinemia no previous cardiac history who had a recent hospitalizations for new onset atrial flutter.  Patient's presenting complaints.  He is receptive.  He reports palliative weeks ago he had a DES to his left arm.  After his first event he noticed discomfort associated with that area with redness streaking.  He stated he had chills for 2 nights prior to starting antibiotic therapy.  He was evaluated in urgent care for an abscess in the back of his left face removed without antibiotic therapy.  In the setting of Jardiance and he was getting irregular heart rate monitoring to include typically having heart rates as normal during activity.  He also started experiencing palpitations which usually ends, blowing through his chest and some associated shortness of breath only on exertion.  The symptoms present after completing his activities is worse while working with running on treadmill or weight lifting.    He presented to the [facility] emergency department  on 04/28/2022 with complaints of shortness of breath and palpitations.  He states that he feels very short of breath only with exertion.  His appointment is next to the department to have his vitals checked.  This artery was extremely high and he needed to be evaluated by the emergency department he continued to have some discomfort to the left upper extremity with a ropelike feeling ultimately.  The monitoring disk had been removed from the arm previously, no pain to the upper extremity.  He had already been on antibiotic therapy.  When he arrived at Emergency Department he was found to be in atrial flutter with rate of 75 bpm, his resting heart rate is usually around 42 bpm. Ultrasound was completed the left upper extremity showed no evidence of DVT.  TEE/DCCV on 04/2622 revealed LVEF 50-55% no wall motion abnormalities, left atrial, left atrial appendage thrombus was detected, mild mitral valve regurgitation.  Underwent successful cardioversion he was shocked 1 time 120 J of synchronized cardioversion postprocedure EKG revealed normal sinus rhythm patient tolerated the procedure well.  He returns clinic today stating that he has been doing extremely well.  He is continued on his Eliquis and has remained in sinus rhythm.  He has continued to work out again with no issues.  He is refraining from some of his recovery tactics due to the concern for bruising from being on the blood thinner.  Denies any chest pain, shortness of breath, palpitations, fluttering, or sensations of air blowing through his heart.  He also denies any further hospitalizations or visits to the emergency department.  Home Medications    Current Outpatient Medications  Medication Sig Dispense Refill   apixaban (ELIQUIS) 5 MG TABS tablet Take 1 tablet (5 mg total) by mouth 2 (two) times daily. 60 tablet 0   cabergoline (DOSTINEX) 0.5 MG tablet Take 3 tablets by mouth 3 (three) times a week.     testosterone cypionate (DEPOTESTOTERONE  CYPIONATE) 100 MG/ML injection Inject into the muscle once a week. For IM use only     VITAMIN D, ERGOCALCIFEROL, PO Take 50,000 Units by mouth once a week.     No current facility-administered medications for this visit.     Family History    Family History  Problem Relation Age of Onset   Alcohol abuse Mother    Hyperlipidemia Mother    Hypertension Mother    Mental illness Mother    Alcohol abuse Father    Hyperlipidemia Father    Hypertension Father    Mental illness Father    Cancer Sister    He indicated that the status of his mother is unknown. He indicated that the status of his father is unknown. He indicated that the status of his sister is unknown.  Social History    Social History   Socioeconomic History   Marital status: Single    Spouse name: Not on file   Number of children: Not on file   Years of education: Not on file   Highest education level: Not on file  Occupational History   Not on file  Tobacco Use   Smoking status: Never   Smokeless tobacco: Never  Substance and Sexual Activity   Alcohol use: Yes   Drug use: No   Sexual activity: Not on file  Other Topics Concern   Not on file  Social History Narrative   ** Merged History Encounter **       Social Determinants of Health   Financial Resource Strain: Not on file  Food Insecurity: Not on file  Transportation Needs: Not on file  Physical Activity: Not on file  Stress: Not on file  Social Connections: Not on file  Intimate Partner Violence: Not on file     Review of Systems    General:  No chills, fever, night sweats or weight changes.  Cardiovascular:  No chest pain, dyspnea on exertion, edema, orthopnea, palpitations, paroxysmal nocturnal dyspnea. Dermatological: No rash, lesions/masses Respiratory: No cough, dyspnea Urologic: No hematuria, dysuria Abdominal:   No nausea, vomiting, diarrhea, bright red blood per rectum, melena, or hematemesis Neurologic:  No visual changes, wkns,  changes in mental status. All other systems reviewed and are otherwise negative except as noted above.   Physical Exam    VS:  BP 106/76 (BP Location: Left Arm, Patient Position: Sitting, Cuff Size: Normal)   Pulse 75   Ht 6' (1.829 m)   Wt 189 lb 9.6 oz (86 kg)   SpO2 98%   BMI 25.71 kg/m  , BMI Body mass index is 25.71 kg/m.     GEN: Well nourished, well developed, in no acute distress. HEENT: normal. Neck: Supple, no JVD, carotid bruits, or masses. Cardiac: RRR, no murmurs, rubs, or gallops. No clubbing, cyanosis, edema.  Radials/DP/PT 2+ and equal bilaterally.  Respiratory:  Respirations regular and unlabored, clear to auscultation bilaterally. GI: Soft, nontender, nondistended, BS + x 4. MS: no deformity or atrophy. Skin: warm and dry, no rash. Neuro:  Strength and sensation are intact. Psych: Normal affect.  Accessory Clinical Findings    ECG personally reviewed  by me today-sinus rhythm with a rate of 75 possible left atrial enlargement and LVH- No acute changes  Lab Results  Component Value Date   WBC 6.3 04/28/2022   HGB 16.3 04/28/2022   HCT 49.8 04/28/2022   MCV 92.9 04/28/2022   PLT 286 04/28/2022   Lab Results  Component Value Date   CREATININE 1.01 04/29/2022   BUN 19 04/29/2022   NA 139 04/29/2022   K 4.6 04/29/2022   CL 103 04/29/2022   CO2 30 04/29/2022   No results found for: "ALT", "AST", "GGT", "ALKPHOS", "BILITOT" No results found for: "CHOL", "HDL", "LDLCALC", "LDLDIRECT", "TRIG", "CHOLHDL"  No results found for: "HGBA1C"  Assessment & Plan   1.  New onset atrial flutter status post TEE/DCCV who remains in sinus rhythm today.  TEE showed LVEF of 50-55% with mild mitral regurgitation.  He is on no current medications for rate controlled as his typical heart rate is in the 40s and 50s as he is extremely active.  He is continued on apixaban 5 mg twice daily and is tolerating this medication without any issues.  He is starting to work out again and  posthospitalization and just got on a camping trip with no issues has not started riding his bike outside and preparing for his next  triathlon so he was continued on apixaban today as a increases his activity again back to his regular exercise regimen.  If he stays in sinus without any episodes it would be reasonable to discontinue the apixaban since his CHA2DS2-VASc score is 0.  At minimum it was discussed to continue to keep him on it for 30 days post cardioversion and he was discharged on 04/29/2022.  After further discussion it was decided to leave him on until he starts continuing with his activity to determine that he stays in sinus versus an atrial flutter rhythm.  If he were to continue to have recurrent atrial flutter consideration of a consultation with the EP for flutter ablation would be reasonable.  When he returns to follow-up he will need an echocardiogram scheduled to better assess the right ventricle and assess his PA pressures as these were concerning findings on the CTA of the chest he had while ruling out pulmonary embolism during the emergency department visit.  He has been advised to avoid some of his recovery tactics due to the increased risk of bleeding.  2.  History of pituitary microadenoma that was diagnosed in 2012 with a 5 cm microadenoma that was found.  He continues on cabergoline and testosterone.  Continues to be followed by his endocrinologist.  3.  Disposition patient return to clinic in 4 to 5 months or sooner if needed to follow-up with Dr. Saunders Revel to determine the discontinuation of apixaban if he has remained free from recurrent a flutter.  Dorene Bruni, NP 05/15/2022, 4:09 PM

## 2022-05-15 ENCOUNTER — Encounter: Payer: Self-pay | Admitting: Cardiology

## 2022-05-15 ENCOUNTER — Ambulatory Visit: Payer: BC Managed Care – PPO | Attending: Cardiology | Admitting: Cardiology

## 2022-05-15 VITALS — BP 106/76 | HR 75 | Ht 72.0 in | Wt 189.6 lb

## 2022-05-15 DIAGNOSIS — I4892 Unspecified atrial flutter: Secondary | ICD-10-CM | POA: Diagnosis not present

## 2022-05-15 DIAGNOSIS — D352 Benign neoplasm of pituitary gland: Secondary | ICD-10-CM | POA: Diagnosis not present

## 2022-05-15 NOTE — Patient Instructions (Signed)
Medication Instructions:  No changes at this time.  *If you need a refill on your cardiac medications before your next appointment, please call your pharmacy*   Lab Work: None  If you have labs (blood work) drawn today and your tests are completely normal, you will receive your results only by: [place] (if you have MyChart) OR A paper copy in the mail If you have any lab test that is abnormal or we need to change your treatment, we will call you to review the results.   Testing/Procedures: None   Follow-Up: At [facility], you and your health needs are our priority.  As part of our continuing mission to provide you with exceptional heart care, we have created designated Provider Care Teams.  These Care Teams include your primary Cardiologist (physician) and Advanced Practice Providers (APPs -  Physician Assistants and Nurse Practitioners) who all work together to provide you with the care you need, when you need it.   Your next appointment:   5 month(s)  The format for your next appointment:   In Person  Provider:   Nelva Bush, MD        Important Information About Sugar

## 2022-05-29 ENCOUNTER — Other Ambulatory Visit: Payer: Self-pay | Admitting: *Deleted

## 2022-05-29 DIAGNOSIS — I4892 Unspecified atrial flutter: Secondary | ICD-10-CM

## 2022-05-29 MED ORDER — APIXABAN 5 MG PO TABS: 5.0000 mg | ORAL_TABLET | Freq: Two times a day (BID) | ORAL | 5 refills | Status: DC

## 2022-05-29 NOTE — Telephone Encounter (Signed)
Eliquis '5mg'$  refill request received. Patient is 46 years old, weight-86kg, Crea-1.01 on 04/29/2022, Diagnosis-Aflutter, and last seen by Gerrie Nordmann on 05/15/2022. Dose is appropriate based on dosing criteria. Will send in refill to requested pharmacy.    Per last OV note on 05/15/2022: Disposition patient return to clinic in 4 to 5 months or sooner if needed to follow-up with Dr. Saunders Revel to determine the discontinuation of apixaban if he has remained free from recurrent a flutter.

## 2022-07-16 DIAGNOSIS — E221 Hyperprolactinemia: Secondary | ICD-10-CM | POA: Diagnosis not present

## 2022-07-16 DIAGNOSIS — E291 Testicular hypofunction: Secondary | ICD-10-CM | POA: Diagnosis not present

## 2022-07-16 DIAGNOSIS — E559 Vitamin D deficiency, unspecified: Secondary | ICD-10-CM | POA: Diagnosis not present

## 2022-07-27 ENCOUNTER — Encounter: Payer: Self-pay | Admitting: Internal Medicine

## 2022-07-29 NOTE — Addendum Note (Signed)
Addended by: Meryl Crutch on: 07/29/2022 01:33 PM   Modules accepted: Orders

## 2022-07-29 NOTE — Telephone Encounter (Signed)
When he was last in we had discussed him increasing back to his regular activity with some limits on recovery treatment to prevent excessive bruising. In the hospital it was recommended that he stay on Eliquis for a minimum of 30 days post cardioversion and if he had no reoccurrence or previously stated symptoms then it would be reasonable for him to come off of the eliquis. It will be fine for him to stop the eliquis but to keep his follow-up in the spring with Dr End.

## 2022-08-07 DIAGNOSIS — E559 Vitamin D deficiency, unspecified: Secondary | ICD-10-CM | POA: Diagnosis not present

## 2022-08-07 DIAGNOSIS — E291 Testicular hypofunction: Secondary | ICD-10-CM | POA: Diagnosis not present

## 2022-08-07 DIAGNOSIS — D497 Neoplasm of unspecified behavior of endocrine glands and other parts of nervous system: Secondary | ICD-10-CM | POA: Diagnosis not present

## 2022-08-07 DIAGNOSIS — E221 Hyperprolactinemia: Secondary | ICD-10-CM | POA: Diagnosis not present

## 2022-10-16 ENCOUNTER — Encounter: Payer: Self-pay | Admitting: Internal Medicine

## 2022-10-16 ENCOUNTER — Ambulatory Visit: Payer: BC Managed Care – PPO | Attending: Internal Medicine | Admitting: Internal Medicine

## 2022-10-16 VITALS — BP 100/68 | HR 51 | Ht 72.0 in | Wt 182.0 lb

## 2022-10-16 DIAGNOSIS — I483 Typical atrial flutter: Secondary | ICD-10-CM | POA: Diagnosis not present

## 2022-10-16 NOTE — Patient Instructions (Signed)
Medication Instructions:  No changes  *If you need a refill on your cardiac medications before your next appointment, please call your pharmacy*   Lab Work: None   Testing/Procedures: None   Follow-Up: At [facility], you and your health needs are our priority.  As part of our continuing mission to provide you with exceptional heart care, we have created designated Provider Care Teams.  These Care Teams include your primary Cardiologist (physician) and Advanced Practice Providers (APPs -  Physician Assistants and Nurse Practitioners) who all work together to provide you with the care you need, when you need it.  We recommend signing up for the patient portal called "MyChart".  Sign up information is provided on this After Visit Summary.  MyChart is used to connect with patients for Virtual Visits (Telemedicine).  Patients are able to view lab/test results, encounter notes, upcoming appointments, etc.  Non-urgent messages can be sent to your provider as well.   To learn more about what you can do with MyChart, go to [email or web address].    Your next appointment:   Follow-up as needed.

## 2022-10-16 NOTE — Progress Notes (Signed)
Follow-up Outpatient Visit Date: 10/16/2022  Primary Care Provider: Dion Body, MD [facility] [place] [address]  Chief Complaint: Follow-up atrial flutter  HPI:  Terry Henson is a 47 y.o. male with history of atrial flutter and hyperprolactinemia, who presents for follow-up of atrial flutter that developed in the setting of left arm cellulitis.  He was last seen in our office by Gerrie Nordmann, NP, following hospitalization for new atrial flutter.  He had undergone TEE-guided cardioversion during his index hospitalization and was maintaining sinus rhythm at his follow-up visit.  We agreed to discontinue anticoagulation in December the setting of no recurrent atrial flutter and CHA2DS2-VASc score of 0.  Today, Terry Henson continues to feel very well.  He has not had any palpitations or other symptoms to suggest recurrence of atrial flutter.  His resting heart rate remains in the upper 30s and low 40s.  He is training for the [employer].  He has not had any chest pain, shortness of breath, or lightheadedness.  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  Current Meds  Medication Sig   cabergoline (DOSTINEX) 0.5 MG tablet Take 3 tablets by mouth 3 (three) times a week.   testosterone cypionate (DEPOTESTOTERONE CYPIONATE) 100 MG/ML injection Inject into the muscle once a week. For IM use only   VITAMIN D, ERGOCALCIFEROL, PO Take 50,000 Units by mouth once a week.    Allergies: Patient has no known allergies.  Social  History   Tobacco Use   Smoking status: Never   Smokeless tobacco: Never  Vaping Use   Vaping Use: Never used  Substance Use Topics   Alcohol use: Yes   Drug use: No    Family History  Problem Relation Age of Onset   Alcohol abuse Mother    Hyperlipidemia Mother    Hypertension Mother    Mental illness Mother    Alcohol abuse Father    Hyperlipidemia Father    Hypertension Father    Mental illness Father    Cancer Sister     Review of Systems: A 12-system review of systems was performed and was negative except as noted in the HPI.  --------------------------------------------------------------------------------------------------  Physical Exam: BP 100/68 (BP Location: Left Arm, Patient Position: Sitting, Cuff Size: Normal)   Pulse (!) 51   Ht 6' (1.829 m)   Wt 182 lb (82.6 kg)   SpO2 98%   BMI 24.68 kg/m   General:  NAD. Neck: No JVD or HJR. Lungs: Clear to auscultation bilaterally without wheezes or crackles. Heart: Bradycardic but regular without murmurs, rubs, or gallops. Abdomen: Soft, nontender, nondistended. Extremities: No lower extremity edema.  EKG:  Sinus bradycardia.  No significant abnormality.  Lab Results  Component Value Date   WBC 6.3 04/28/2022   HGB 16.3 04/28/2022   HCT 49.8 04/28/2022   MCV 92.9 04/28/2022   PLT 286 04/28/2022    Lab Results  Component Value Date   NA 139 04/29/2022   K 4.6 04/29/2022   CL 103 04/29/2022   CO2 30 04/29/2022   BUN  19 04/29/2022   CREATININE 1.01 04/29/2022   GLUCOSE 86 04/29/2022   --------------------------------------------------------------------------------------------------  ASSESSMENT AND PLAN: Atrial flutter: Terry Henson remains in sinus rhythm.  He has not had any symptoms to suggest recurrent atrial flutter.  We discussed referral to EP for consideration of ablation but have agreed to defer this given lack of recurrence and suspicion that his cellulitis precipitated his atrial  arrhythmia last fall.  He will reach out to [place] if he has any palpitations or notices his resting heart rate rise above his baseline.  Anticoagulation is not indicated given a CHA2DS2-VASc score of 0.  Follow-up: Return to clinic as needed.  Nelva Bush, MD 10/16/2022 6:09 PM

## 2022-11-04 NOTE — Progress Notes (Unsigned)
    Terry Henson D.[place] Phone: [phone]   Assessment and Plan:     There are no diagnoses linked to this encounter.  ***   Pertinent previous records reviewed include ***   Follow Up: ***     Subjective:   I, Terry Henson, am serving as a Education administrator for Doctor Glennon Mac  Chief Complaint: left ankle pain   HPI:   11/05/2022 Patient is a 47 year old male complaining of left ankle pain. Patient states  Relevant Historical Information: ***  Additional pertinent review of systems negative.   Current Outpatient Medications:    cabergoline (DOSTINEX) 0.5 MG tablet, Take 3 tablets by mouth 3 (three) times a week., Disp: , Rfl:    testosterone cypionate (DEPOTESTOTERONE CYPIONATE) 100 MG/ML injection, Inject into the muscle once a week. For IM use only, Disp: , Rfl:    VITAMIN D, ERGOCALCIFEROL, PO, Take 50,000 Units by mouth once a week., Disp: , Rfl:    Objective:     There were no vitals filed for this visit.    There is no height or weight on file to calculate BMI.    Physical Exam:    ***   Electronically signed by:  Terry Henson D.[facility] 7:36 AM 11/04/22

## 2022-11-05 ENCOUNTER — Encounter: Payer: Self-pay | Admitting: Sports Medicine

## 2022-11-05 ENCOUNTER — Ambulatory Visit: Payer: BC Managed Care – PPO | Admitting: Sports Medicine

## 2022-11-05 VITALS — BP 100/80 | HR 66 | Ht 72.0 in | Wt 182.0 lb

## 2022-11-05 DIAGNOSIS — S86312A Strain of muscle(s) and tendon(s) of peroneal muscle group at lower leg level, left leg, initial encounter: Secondary | ICD-10-CM

## 2022-11-05 NOTE — Patient Instructions (Addendum)
Good to see you  - Start meloxicam 15 mg daily x2 weeks.  If still having pain after 2 weeks, complete 3rd-week of meloxicam. May use remaining meloxicam as needed once daily for pain control.  Do not to use additional NSAIDs while taking meloxicam.  May use Tylenol [phone] mg 2 to 3 times a day for breakthrough pain. Stop taking naproxen  Next Wednesday follow up

## 2022-11-06 ENCOUNTER — Other Ambulatory Visit: Payer: Self-pay | Admitting: Sports Medicine

## 2022-11-06 MED ORDER — MELOXICAM 15 MG PO TABS: 15.0000 mg | ORAL_TABLET | Freq: Every day | ORAL | 0 refills | Status: DC

## 2022-11-06 NOTE — Telephone Encounter (Signed)
Meloxicam was placed to pharmacy

## 2022-11-11 NOTE — Progress Notes (Unsigned)
    Aleen Sells D.[facility] [address] [place] [address] Phone: [phone]   Assessment and Plan:     There are no diagnoses linked to this encounter.  ***   Pertinent previous records reviewed include ***   Follow Up: ***     Subjective:   I, Raysean Graumann, am serving as a Neurosurgeon for Doctor Richardean Sale   Chief Complaint: left ankle pain    HPI:    11/05/2022 Patient is a 47 year old male complaining of left ankle pain. Patient states that he is running [place] marathon , Saturday morning he had immediate pain left ankle , aleve and ice have helped , pain has gotten better, he is able to bike and swim he has a "whisper" of pain , he nots its been a slow progressive build   11/12/2022 Patient states    Relevant Historical Information: None pertinent  Additional pertinent review of systems negative.   Current Outpatient Medications:    cabergoline (DOSTINEX) 0.5 MG tablet, Take 3 tablets by mouth 3 (three) times a week., Disp: , Rfl:    meloxicam (MOBIC) 15 MG tablet, Take 1 tablet (15 mg total) by mouth daily., Disp: 30 tablet, Rfl: 0   testosterone cypionate (DEPOTESTOTERONE CYPIONATE) 100 MG/ML injection, Inject into the muscle once a week. For IM use only, Disp: , Rfl:    VITAMIN D, ERGOCALCIFEROL, PO, Take 50,000 Units by mouth once a week., Disp: , Rfl:    Objective:     There were no vitals filed for this visit.    There is no height or weight on file to calculate BMI.    Physical Exam:    ***   Electronically signed by:  Aleen Sells D.[facility] 7:28 AM 11/11/22

## 2022-11-12 ENCOUNTER — Ambulatory Visit: Payer: BC Managed Care – PPO | Admitting: Sports Medicine

## 2022-11-12 ENCOUNTER — Other Ambulatory Visit: Payer: Self-pay

## 2022-11-12 VITALS — Ht 72.0 in | Wt 187.0 lb

## 2022-11-12 DIAGNOSIS — S86312A Strain of muscle(s) and tendon(s) of peroneal muscle group at lower leg level, left leg, initial encounter: Secondary | ICD-10-CM | POA: Diagnosis not present

## 2022-12-02 NOTE — Progress Notes (Unsigned)
    Terry Henson D.[facility] [address] [place] [address] Phone: [phone]   Assessment and Plan:     There are no diagnoses linked to this encounter.  ***   Pertinent previous records reviewed include ***   Follow Up: ***     Subjective:   I, Joenathan Sakuma, am serving as a Neurosurgeon for Doctor Richardean Sale   Chief Complaint: left ankle pain    HPI:    11/05/2022 Patient is a 47 year old male complaining of left ankle pain. Patient states that he is running [place] , Saturday morning he had immediate pain left ankle , aleve and ice have helped , pain has gotten better, he is able to bike and swim he has a "whisper" of pain , he nots its been a slow progressive build    11/12/2022 Patient states that he is still tender , ran Thursday 20 easy felt great , Friday 40 min pain, sat ran off the bike felt great , sat 70 min run crippled the rest of the day, Monday after noon he recovered, today he is okay but point tender , outside is good , uphill is fine , down hill sucks    12/03/2022 Patient states     Relevant Historical Information: None pertinent  Additional pertinent review of systems negative.   Current Outpatient Medications:    cabergoline (DOSTINEX) 0.5 MG tablet, Take 3 tablets by mouth 3 (three) times a week., Disp: , Rfl:    meloxicam (MOBIC) 15 MG tablet, Take 1 tablet (15 mg total) by mouth daily., Disp: 30 tablet, Rfl: 0   testosterone cypionate (DEPOTESTOTERONE CYPIONATE) 100 MG/ML injection, Inject into the muscle once a week. For IM use only, Disp: , Rfl:    VITAMIN D, ERGOCALCIFEROL, PO, Take 50,000 Units by mouth once a week., Disp: , Rfl:    Objective:     There were no vitals filed for this visit.    There is no height or weight on file to calculate BMI.    Physical Exam:    ***   Electronically signed by:  Terry Henson D.[facility] 7:19 AM 12/02/22

## 2022-12-03 ENCOUNTER — Ambulatory Visit (INDEPENDENT_AMBULATORY_CARE_PROVIDER_SITE_OTHER): Payer: BC Managed Care – PPO

## 2022-12-03 ENCOUNTER — Ambulatory Visit: Payer: BC Managed Care – PPO | Admitting: Sports Medicine

## 2022-12-03 VITALS — BP 122/80 | HR 58 | Ht 72.0 in | Wt 176.0 lb

## 2022-12-03 DIAGNOSIS — M79672 Pain in left foot: Secondary | ICD-10-CM | POA: Diagnosis not present

## 2022-12-03 DIAGNOSIS — M25572 Pain in left ankle and joints of left foot: Secondary | ICD-10-CM | POA: Diagnosis not present

## 2022-12-03 DIAGNOSIS — S86312A Strain of muscle(s) and tendon(s) of peroneal muscle group at lower leg level, left leg, initial encounter: Secondary | ICD-10-CM | POA: Diagnosis not present

## 2022-12-03 MED ORDER — MELOXICAM 15 MG PO TABS: 15.0000 mg | ORAL_TABLET | Freq: Every day | ORAL | 0 refills | Status: DC

## 2022-12-03 NOTE — Patient Instructions (Addendum)
Xrays on the way out  MRI referral  Meloxicam as needed Follow up 3 days after MRI to discuss results

## 2023-08-24 DIAGNOSIS — E221 Hyperprolactinemia: Secondary | ICD-10-CM | POA: Diagnosis not present

## 2023-08-24 DIAGNOSIS — E559 Vitamin D deficiency, unspecified: Secondary | ICD-10-CM | POA: Diagnosis not present

## 2023-08-24 DIAGNOSIS — E291 Testicular hypofunction: Secondary | ICD-10-CM | POA: Diagnosis not present

## 2023-08-27 DIAGNOSIS — E221 Hyperprolactinemia: Secondary | ICD-10-CM | POA: Diagnosis not present

## 2023-08-27 DIAGNOSIS — E559 Vitamin D deficiency, unspecified: Secondary | ICD-10-CM | POA: Diagnosis not present

## 2023-08-27 DIAGNOSIS — D497 Neoplasm of unspecified behavior of endocrine glands and other parts of nervous system: Secondary | ICD-10-CM | POA: Diagnosis not present

## 2023-08-27 DIAGNOSIS — E291 Testicular hypofunction: Secondary | ICD-10-CM | POA: Diagnosis not present

## 2023-09-26 ENCOUNTER — Ambulatory Visit
Admission: EM | Admit: 2023-09-26 | Discharge: 2023-09-26 | Disposition: A | Discharge status: Home / Self Care | Payer: BC Managed Care – PPO | Attending: Emergency Medicine | Admitting: Emergency Medicine

## 2023-09-26 DIAGNOSIS — L0231 Cutaneous abscess of buttock: Secondary | ICD-10-CM | POA: Diagnosis not present

## 2023-09-26 MED ORDER — DOXYCYCLINE HYCLATE 100 MG PO CAPS: 100.0000 mg | ORAL_CAPSULE | Freq: Two times a day (BID) | ORAL | 0 refills | Status: AC

## 2023-09-26 NOTE — ED Triage Notes (Signed)
 Pt states he is having possible abscess/bump on right groin/buttocks that started Thursday. Now having redness and pain.

## 2023-09-26 NOTE — Discharge Instructions (Addendum)
 Take the doxycycline as directed.  Use warm compresses as directed.  Follow-up with your primary care provider on Monday.  Go to the emergency department if you have worsening symptoms.

## 2023-09-26 NOTE — ED Provider Notes (Signed)
 Terry Henson    CSN: [id number] Arrival date & time: 09/26/23  1032      History   Chief Complaint Chief Complaint  Patient presents with   Abscess    HPI Terry Henson is a 48 y.o. male.   Patient presents with painful red area on his right buttock x 2 to 3 days.  He has tender swollen lymph nodes in his right groin.  No fever, chills, wound drainage.  He denies penile discharge or testicular pain.  No OTC medications taken.  The history is provided by the patient and medical records.    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   Pituitary tumor 01/27/2018   Hamstring injury, left, initial encounter 12/14/2017   Injury of adductor muscle and tendon of right thigh 12/29/2015   Lumbar radiculopathy 12/28/2015   Hyperprolactinemia (HCC) 12/08/2013   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 Authorizing Provider  cabergoline (DOSTINEX) 0.5 MG tablet Take 3 tablets by mouth 3  (three) times a week. 04/10/14  Yes [provider]  doxycycline (VIBRAMYCIN) 100 MG capsule Take 1 capsule (100 mg total) by mouth 2 (two) times daily. 09/26/23  Yes Mickie Bail, NP  testosterone cypionate (DEPOTESTOTERONE CYPIONATE) 100 MG/ML injection Inject into the muscle once a week. For IM use only   Yes [provider]  VITAMIN D, ERGOCALCIFEROL, PO Take 50,000 Units by mouth once a week.   Yes [provider]  meloxicam (MOBIC) 15 MG tablet Take 1 tablet (15 mg total) by mouth daily. 11/06/22   Richardean Sale, DO  meloxicam (MOBIC) 15 MG tablet Take 1 tablet (15 mg total) by mouth daily. 12/03/22   Richardean Sale, DO    Family History Family History  Problem Relation Age of Onset   Alcohol abuse Mother    Hyperlipidemia Mother    Hypertension Mother    Mental illness Mother    Alcohol abuse Father    Hyperlipidemia Father    Hypertension Father    Mental illness Father    Cancer Sister     Social History Social History   Tobacco Use   Smoking status: Never   Smokeless tobacco: Never  Vaping Use   Vaping status: Never Used  Substance Use Topics   Alcohol use: Not Currently   Drug use: No     Allergies   Patient has no known allergies.   Review of Systems Review of Systems  Constitutional:  Negative for chills and fever.  Musculoskeletal:  Negative for arthralgias, gait problem and joint swelling.  Skin:  Positive for color change and wound.  Neurological:  Negative for weakness and numbness.     Physical Exam Triage Vital Signs ED Triage Vitals  Encounter Vitals Group     BP 09/26/23 1211 102/65     Systolic BP Percentile --      Diastolic BP Percentile --      Pulse Rate 09/26/23 1211 69     Resp 09/26/23 1211 18     Temp 09/26/23 1211 99.8 F (37.7 C)     Temp Source 09/26/23 1211 Oral     SpO2 09/26/23 1211 97 %     Weight --      Height --      Head Circumference --      Peak Flow --      Pain Score 09/26/23  1212 9     Pain Loc --      Pain Education --      Exclude from Growth Chart --    No data found.  Updated Vital Signs BP 102/65 (BP Location: Right Arm)   Pulse 69   Temp 99.8 F (37.7 C) (Oral)   Resp 18   SpO2 97%   Visual Acuity Right Eye Distance:   Left Eye Distance:   Bilateral Distance:    Right Eye Near:   Left Eye Near:    Bilateral Near:     Physical Exam Constitutional:      General: He is not in acute distress. HENT:     Mouth/Throat:     Mouth: Mucous membranes are moist.  Cardiovascular:     Rate and Rhythm: Normal rate and regular rhythm.  Pulmonary:     Effort: Pulmonary effort is normal. No respiratory distress.  Abdominal:     Comments: Tender enlarged right inguinal lymph nodes.  Skin:    General: Skin is warm and dry.     Findings: Erythema and lesion present.     Comments: Right buttock: 8 cm x 8 cm area of firm nonfluctuant induration and erythema.  No open wounds or drainage.  Neurological:     General: No focal deficit present.     Mental Status: He is alert and oriented to person, place, and time.     Gait: Gait normal.      UC Treatments / Results  Labs (all labs ordered are listed, but only abnormal results are displayed) Labs Reviewed - No data to display  EKG   Radiology No results found.  Procedures Procedures (including critical care time)  Medications Ordered in UC Medications - No data to display  Initial Impression / Assessment and Plan / UC Course  I have reviewed the triage vital signs and the nursing notes.  Pertinent labs & imaging results that were available during my care of the patient were reviewed by me and considered in my medical decision making (see chart for details).    Abscess of right buttock.  The area is not ready for I&D.  It is firm and nonfluctuant.  Treating with doxycycline and warm compresses.  Instructed patient to follow-up with his PCP on Monday.  Education provided on skin abscess.   ED precautions given.  He agrees to plan of care.  Final Clinical Impressions(s) / UC Diagnoses   Final diagnoses:  Abscess of right buttock     Discharge Instructions      Take the  doxycycline as directed.  Use warm compresses as directed.  Follow-up with your primary care provider on Monday.  Go to the emergency department if you have worsening symptoms.     ED Prescriptions     Medication Sig Dispense Auth. Provider   doxycycline (VIBRAMYCIN) 100 MG capsule Take 1 capsule (100 mg total) by mouth 2 (two) times daily. 20 capsule Mickie Bail, NP      PDMP not reviewed this encounter.   Mickie Bail, NP 09/26/23 [phone]

## 2023-11-07 ENCOUNTER — Other Ambulatory Visit: Payer: Self-pay | Admitting: Sports Medicine

## 2024-01-27 ENCOUNTER — Other Ambulatory Visit: Payer: Self-pay | Admitting: Sports Medicine

## 2024-04-05 ENCOUNTER — Ambulatory Visit: Admitting: Sports Medicine

## 2024-04-05 VITALS — HR 38 | Ht 72.0 in

## 2024-04-05 DIAGNOSIS — M9905 Segmental and somatic dysfunction of pelvic region: Secondary | ICD-10-CM | POA: Diagnosis not present

## 2024-04-05 DIAGNOSIS — M9904 Segmental and somatic dysfunction of sacral region: Secondary | ICD-10-CM

## 2024-04-05 DIAGNOSIS — M9903 Segmental and somatic dysfunction of lumbar region: Secondary | ICD-10-CM | POA: Diagnosis not present

## 2024-04-05 DIAGNOSIS — S76012A Strain of muscle, fascia and tendon of left hip, initial encounter: Secondary | ICD-10-CM | POA: Diagnosis not present

## 2024-04-05 MED ORDER — MELOXICAM 15 MG PO TABS: 15.0000 mg | ORAL_TABLET | Freq: Every day | ORAL | 0 refills | Status: AC

## 2024-04-05 NOTE — Patient Instructions (Addendum)
 Continue meloxicam  for 10 days   Relative rest, avoiding activities that heavily load glute muscles   Refill meloxicam    2-3 week follow up as needed

## 2024-04-05 NOTE — Progress Notes (Signed)
 [facility] [address] [place] [address] Phone: [phone]   Assessment and Plan:     1. Muscle strain of left gluteal region, initial encounter (Primary) 2. Somatic dysfunction of lumbar region 3. Somatic dysfunction of pelvic region 4. Somatic dysfunction of sacral region -Acute, initial visit - Most consistent with left gluteal muscle strain, primarily gluteus minimus, gluteus medius, piriformis based on HPI and physical exam.  It is possible that patient experienced a localized reaction after testosterone  injection in left gluteal musculature that has resulted in continued muscle soreness.  No ecchymosis, weakness, acute MOI, so low suspicion for tear - Continue physical activity as tolerated.  Decreased physical activity that focuses on glutes strengthening for 2 weeks and then gradually reintroduce activities as tolerated - Continue meloxicam  15 mg daily for an additional 10 days to complete a 2-week course.  Refill provided.  Afterwards return to as needed meloxicam  use - May use heat, dry needling, cupping techniques to decrease symptoms - Patient elected for initial OMT today.  Tolerated well per note below. - Decision today to treat with OMT was based on Physical Exam  Time of visit 32 minutes, which included chart review, physical exam, treatment plan being performed, interpreted, and discussed with patient at today's visit.   After verbal consent patient was treated for an additional 15 minutes with HVLA (high velocity low amplitude), ME (muscle energy), FPR (flex positional release), ST (soft tissue), PC/PD (Pelvic Compression/ Pelvic Decompression) techniques in sacrum, lumbar, and pelvic areas. Patient tolerated the procedure well with improvement in symptoms.  Patient educated on potential side effects of soreness and recommended to rest, hydrate, and use Tylenol  as needed for pain control.     Pertinent previous  records reviewed include none   Follow Up: As needed if no improvement or worsening of symptoms in 2 to 3 weeks.  Could obtain ultrasound and consider CSI   Subjective:   I, Terry Henson, am serving as a Neurosurgeon for Doctor Morene Mace  Chief Complaint: left hip pain   HPI:   04/05/24 Patient is a 48 year old male with left hip pain. 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 Left glute didn't want to go that Monday after the Saturday rehearsal. Has been dry needling and cupping. Has tried RICES. Was able to ride his bike this weekend. Pain radiates down the left leg and into the adductors. Has world champs in November in [place]. Has been taking meloxicam  since Thursday doesn't know if affective yet. Standing is painful. He is TTP he can point to the spot. Decreased ROM. States he just needs a pop    Relevant Historical Information: None pertinent  Additional pertinent review of systems negative.   Current Outpatient Medications:    cabergoline  (DOSTINEX ) 0.5 MG tablet, Take 3 tablets by mouth 3 (three) times a week., Disp: , Rfl:    doxycycline  (VIBRAMYCIN ) 100 MG capsule, Take 1 capsule (100 mg total) by mouth 2 (two) times daily., Disp: 20 capsule, Rfl: 0   meloxicam  (MOBIC ) 15 MG tablet, TAKE 1 TABLET BY MOUTH DAILY AS NEEDED FOR PAIN, Disp: 30 tablet, Rfl: 0   meloxicam  (MOBIC ) 15 MG tablet, Take 1 tablet (15 mg total) by mouth daily., Disp: 30 tablet, Rfl: 0   testosterone  cypionate (DEPOTESTOTERONE CYPIONATE) 100 MG/ML injection, Inject into the muscle once a week. For IM use only, Disp: , Rfl:    VITAMIN D , ERGOCALCIFEROL , PO, Take 50,000  Units by mouth once a week., Disp: , Rfl:    Objective:     Vitals:   04/05/24 1549  Pulse: (!) 38  SpO2: 98%  Height: 6' (1.829 m)      Body mass index is 23.87 kg/m.    Physical Exam:    General: awake, alert, and oriented no acute distress, nontoxic Skin: no suspicious lesions or rashes Neuro:sensation intact  distally with no deficits, normal muscle tone, no atrophy, strength 5/5 in all tested lower ext groups Psych: normal mood and affect, speech clear   Left hip: No deformity, swelling or wasting ROM Flexion 90, ext 30, IR 45, ER 45 TTP mildly left gluteal musculature NTTP over the hip flexors, greater trochanter,  si joint, lumbar spine Negative log roll with FROM Negative FABER Negative FADIR Negative Piriformis test for radicular symptoms, though increased tension on left compared to right Negative trendelenberg Gait normal Straight leg raise negative, though increased tension along proximal hamstring and lower lumbar spine on the left compared to right  OMT Physical Exam:  ASIS Compression Test: Positive left Sacrum: Positive sphinx, mild TTP left sacral base Lumbar: TTP paraspinal, L1-L3 RLSR Pelvis: Left posterior innominate    Electronically signed by:  Odis Mace D.CLEMENTEEN [facility] 4:34 PM 04/05/24

## 2024-04-11 DIAGNOSIS — S76012A Strain of muscle, fascia and tendon of left hip, initial encounter: Secondary | ICD-10-CM | POA: Diagnosis not present

## 2024-04-11 DIAGNOSIS — M62830 Muscle spasm of back: Secondary | ICD-10-CM | POA: Diagnosis not present

## 2024-04-11 DIAGNOSIS — M62452 Contracture of muscle, left thigh: Secondary | ICD-10-CM | POA: Diagnosis not present

## 2024-04-11 DIAGNOSIS — R293 Abnormal posture: Secondary | ICD-10-CM | POA: Diagnosis not present

## 2024-04-11 DIAGNOSIS — M545 Low back pain, unspecified: Secondary | ICD-10-CM | POA: Diagnosis not present

## 2024-04-11 DIAGNOSIS — M9903 Segmental and somatic dysfunction of lumbar region: Secondary | ICD-10-CM | POA: Diagnosis not present

## 2024-04-11 DIAGNOSIS — M9906 Segmental and somatic dysfunction of lower extremity: Secondary | ICD-10-CM | POA: Diagnosis not present

## 2024-04-14 DIAGNOSIS — S76012A Strain of muscle, fascia and tendon of left hip, initial encounter: Secondary | ICD-10-CM | POA: Diagnosis not present

## 2024-04-14 DIAGNOSIS — M9906 Segmental and somatic dysfunction of lower extremity: Secondary | ICD-10-CM | POA: Diagnosis not present

## 2024-04-14 DIAGNOSIS — M62830 Muscle spasm of back: Secondary | ICD-10-CM | POA: Diagnosis not present

## 2024-04-14 DIAGNOSIS — M9903 Segmental and somatic dysfunction of lumbar region: Secondary | ICD-10-CM | POA: Diagnosis not present

## 2024-04-14 DIAGNOSIS — R293 Abnormal posture: Secondary | ICD-10-CM | POA: Diagnosis not present

## 2024-04-14 DIAGNOSIS — M545 Low back pain, unspecified: Secondary | ICD-10-CM | POA: Diagnosis not present

## 2024-04-14 DIAGNOSIS — M62452 Contracture of muscle, left thigh: Secondary | ICD-10-CM | POA: Diagnosis not present
